# Patient Record
Sex: Male | Born: 1945
Health system: Southern US, Community
[De-identification: ages and names within clinical notes are randomized; demographics above are authoritative.]

## PROBLEM LIST (undated history)

## (undated) DIAGNOSIS — K635 Polyp of colon: Secondary | ICD-10-CM

## (undated) DIAGNOSIS — R55 Syncope and collapse: Secondary | ICD-10-CM

## (undated) DIAGNOSIS — M1990 Unspecified osteoarthritis, unspecified site: Secondary | ICD-10-CM

## (undated) DIAGNOSIS — B019 Varicella without complication: Secondary | ICD-10-CM

## (undated) DIAGNOSIS — K219 Gastro-esophageal reflux disease without esophagitis: Secondary | ICD-10-CM

## (undated) DIAGNOSIS — G500 Trigeminal neuralgia: Secondary | ICD-10-CM

## (undated) HISTORY — DX: Varicella without complication: B01.9

## (undated) HISTORY — PX: REPLACEMENT TOTAL HIP W/  RESURFACING IMPLANTS: SUR1222

## (undated) HISTORY — DX: Polyp of colon: K63.5

## (undated) HISTORY — DX: Gastro-esophageal reflux disease without esophagitis: K21.9

## (undated) HISTORY — DX: Trigeminal neuralgia: G50.0

## (undated) HISTORY — DX: Unspecified osteoarthritis, unspecified site: M19.90

## (undated) HISTORY — DX: Syncope and collapse: R55

---

## 1998-01-23 ENCOUNTER — Ambulatory Visit (HOSPITAL_COMMUNITY): Admission: RE | Admit: 1998-01-23 | Discharge: 1998-01-23 | Payer: Self-pay | Admitting: Gastroenterology

## 1999-02-13 ENCOUNTER — Ambulatory Visit (HOSPITAL_COMMUNITY): Admission: RE | Admit: 1999-02-13 | Discharge: 1999-02-13 | Payer: Self-pay | Admitting: Emergency Medicine

## 1999-02-13 ENCOUNTER — Encounter: Payer: Self-pay | Admitting: Emergency Medicine

## 2001-02-09 ENCOUNTER — Ambulatory Visit (HOSPITAL_COMMUNITY): Admission: RE | Admit: 2001-02-09 | Discharge: 2001-02-09 | Payer: Self-pay | Admitting: Gastroenterology

## 2006-04-14 ENCOUNTER — Encounter: Admission: RE | Admit: 2006-04-14 | Discharge: 2006-04-14 | Payer: Self-pay | Admitting: Emergency Medicine

## 2010-03-31 DIAGNOSIS — K635 Polyp of colon: Secondary | ICD-10-CM

## 2010-03-31 HISTORY — DX: Polyp of colon: K63.5

## 2011-04-01 HISTORY — PX: FOOT SURGERY: SHX648

## 2011-10-22 ENCOUNTER — Encounter: Payer: Self-pay | Admitting: Family Medicine

## 2011-10-22 ENCOUNTER — Ambulatory Visit (INDEPENDENT_AMBULATORY_CARE_PROVIDER_SITE_OTHER): Payer: BC Managed Care – PPO | Admitting: Family Medicine

## 2011-10-22 VITALS — BP 136/80 | HR 72 | Temp 99.2°F | Resp 12 | Ht 71.0 in | Wt 208.0 lb

## 2011-10-22 DIAGNOSIS — I10 Essential (primary) hypertension: Secondary | ICD-10-CM

## 2011-10-22 DIAGNOSIS — D126 Benign neoplasm of colon, unspecified: Secondary | ICD-10-CM

## 2011-10-22 DIAGNOSIS — K635 Polyp of colon: Secondary | ICD-10-CM

## 2011-10-22 DIAGNOSIS — M159 Polyosteoarthritis, unspecified: Secondary | ICD-10-CM

## 2011-10-22 DIAGNOSIS — E785 Hyperlipidemia, unspecified: Secondary | ICD-10-CM | POA: Insufficient documentation

## 2011-10-22 DIAGNOSIS — R55 Syncope and collapse: Secondary | ICD-10-CM

## 2011-10-22 MED ORDER — LISINOPRIL 20 MG PO TABS: 20.0000 mg | ORAL_TABLET | Freq: Every day | ORAL | Status: DC

## 2011-10-22 MED ORDER — SIMVASTATIN 20 MG PO TABS: 20.0000 mg | ORAL_TABLET | Freq: Every evening | ORAL | Status: DC

## 2011-10-22 NOTE — Progress Notes (Signed)
  Subjective:    Patient ID: Adam Choi, male    DOB: [birth year], 66 y.o.   MRN: [id number]  HPI  New patient to establish care. Past medical history significant for hypertension, hyperlipidemia, osteoarthritis mostly involving lumbar spine and hips, past history of syncope, and colon polyps. He is retired Social research officer, government. He also has history of GERD which is fairly well controlled with over-the-counter medications. Only regular medications are lisinopril 20 mg daily and simvastatin 20 mg daily.  Patient had couple previous syncopal episodes which were related to rapid standing and felt to be orthostatic related. Workup unrevealing. No chest pain. No cardiac history. Compliant with medications. Takes Aleve for low back pain which generally works well. Blood pressure well-controlled at home of 130/85 generally.  Family history mother, sister, and daughter with breast cancer history. Mom had type 2 diabetes and history of stroke. Father had emphysema.  Patient is married. He has 2 children and one stepchild. Retired Social research officer, government. Ex-smoker. Occasional alcohol use. Walks for exercise.  Past Medical History  Diagnosis Date  . Arthritis   . GERD (gastroesophageal reflux disease)   . Colon polyp 2012  . Fainting spell   . Chicken pox    Past Surgical History  Procedure Date  . Foot surgery 2013    halux rigidus/bones spur    reports that he quit smoking about 3 years ago. His smoking use included Cigarettes. He has a 15 pack-year smoking history. He does not have any smokeless tobacco history on file. His alcohol and drug histories not on file. family history includes Cancer in his mother; Diabetes in his mother; Emphysema in his father; and Stroke in his mother. No Known Allergies    Review of Systems  Constitutional: Negative for fatigue.  Eyes: Negative for visual disturbance.  Respiratory: Negative for cough, chest tightness and shortness of breath.     Cardiovascular: Negative for chest pain, palpitations and leg swelling.  Neurological: Negative for dizziness, syncope, weakness, light-headedness and headaches.       Objective:   Physical Exam  Constitutional: He appears well-developed and well-nourished.  HENT:  Right Ear: External ear normal.  Left Ear: External ear normal.  Mouth/Throat: Oropharynx is clear and moist.  Neck: Neck supple.  Cardiovascular: Normal rate and regular rhythm.   Pulmonary/Chest: Effort normal and breath sounds normal. No respiratory distress. He has no wheezes. He has no rales.  Musculoskeletal: He exhibits no edema.          Assessment & Plan:  #1 hypertension. Stable by home readings. Refill medication for one year #2 hyperlipidemia. Recent labs reviewed from March of this year from prior practice with lipids at goal. Refill simvastatin for one year #3 osteoarthritis mostly involving hips and back. Continue Aleve #4 history of colon polyps. Followed closely by gastroenterology  #5 history of syncope presumably related orthostasis. One episode occurred after getting blood drawn-next day. Followup promptly if he has any recurrent episodes

## 2012-01-13 ENCOUNTER — Ambulatory Visit: Payer: BC Managed Care – PPO

## 2012-01-14 ENCOUNTER — Ambulatory Visit (INDEPENDENT_AMBULATORY_CARE_PROVIDER_SITE_OTHER): Payer: BC Managed Care – PPO

## 2012-01-14 DIAGNOSIS — Z23 Encounter for immunization: Secondary | ICD-10-CM

## 2012-12-01 ENCOUNTER — Other Ambulatory Visit: Payer: BC Managed Care – PPO

## 2012-12-03 ENCOUNTER — Other Ambulatory Visit (INDEPENDENT_AMBULATORY_CARE_PROVIDER_SITE_OTHER): Payer: Medicare Other

## 2012-12-03 DIAGNOSIS — Z0000 Encounter for general adult medical examination without abnormal findings: Secondary | ICD-10-CM

## 2012-12-03 DIAGNOSIS — Z125 Encounter for screening for malignant neoplasm of prostate: Secondary | ICD-10-CM

## 2012-12-03 DIAGNOSIS — E785 Hyperlipidemia, unspecified: Secondary | ICD-10-CM

## 2012-12-03 LAB — LIPID PANEL
Cholesterol: 187 mg/dL (ref 0–200)
HDL: 61.9 mg/dL (ref >39.00)
LDL Cholesterol: 103 mg/dL — ABNORMAL HIGH (ref 0–99)
Total CHOL/HDL Ratio: 3
Triglycerides: 109 mg/dL (ref 0.0–149.0)
VLDL: 21.8 mg/dL (ref 0.0–40.0)

## 2012-12-03 LAB — BASIC METABOLIC PANEL
BUN: 16 mg/dL (ref 6–23)
CO2: 31 mEq/L (ref 19–32)
Calcium: 9.6 mg/dL (ref 8.4–10.5)
Chloride: 101 mEq/L (ref 96–112)
Creatinine, Ser: 1.3 mg/dL (ref 0.4–1.5)
GFR: 59.81 mL/min — ABNORMAL LOW (ref >60.00)
Glucose, Bld: 84 mg/dL (ref 70–99)
Potassium: 4.6 mEq/L (ref 3.5–5.1)
Sodium: 136 mEq/L (ref 135–145)

## 2012-12-03 LAB — POCT URINALYSIS DIPSTICK
Bilirubin, UA: NEGATIVE
Blood, UA: NEGATIVE
Glucose, UA: NEGATIVE
Ketones, UA: NEGATIVE
Leukocytes, UA: NEGATIVE
Nitrite, UA: NEGATIVE
Protein, UA: NEGATIVE
Spec Grav, UA: 1.01
Urobilinogen, UA: 0.2
pH, UA: 7

## 2012-12-03 LAB — CBC WITH DIFFERENTIAL/PLATELET
Basophils Absolute: 0 10*3/uL (ref 0.0–0.1)
Basophils Relative: 0.6 % (ref 0.0–3.0)
Eosinophils Absolute: 0.1 10*3/uL (ref 0.0–0.7)
Eosinophils Relative: 1.4 % (ref 0.0–5.0)
HCT: 44.3 % (ref 39.0–52.0)
Hemoglobin: 15 g/dL (ref 13.0–17.0)
Lymphocytes Relative: 27.8 % (ref 12.0–46.0)
Lymphs Abs: 1.9 10*3/uL (ref 0.7–4.0)
MCHC: 33.8 g/dL (ref 30.0–36.0)
MCV: 91.9 fl (ref 78.0–100.0)
Monocytes Absolute: 0.6 10*3/uL (ref 0.1–1.0)
Monocytes Relative: 9.3 % (ref 3.0–12.0)
Neutro Abs: 4.1 10*3/uL (ref 1.4–7.7)
Neutrophils Relative %: 60.9 % (ref 43.0–77.0)
Platelets: 232 10*3/uL (ref 150.0–400.0)
RBC: 4.82 Mil/uL (ref 4.22–5.81)
RDW: 13 % (ref 11.5–14.6)
WBC: 6.8 10*3/uL (ref 4.5–10.5)

## 2012-12-03 LAB — HEPATIC FUNCTION PANEL
Albumin: 4 g/dL (ref 3.5–5.2)
Alkaline Phosphatase: 58 U/L (ref 39–117)
Total Protein: 6.8 g/dL (ref 6.0–8.3)

## 2012-12-03 LAB — TSH: TSH: 1.16 u[IU]/mL (ref 0.35–5.50)

## 2012-12-03 LAB — PSA: PSA: 0.53 ng/mL (ref 0.10–4.00)

## 2012-12-13 ENCOUNTER — Encounter: Payer: Self-pay | Admitting: Family Medicine

## 2012-12-13 ENCOUNTER — Ambulatory Visit (INDEPENDENT_AMBULATORY_CARE_PROVIDER_SITE_OTHER): Payer: Medicare Other | Admitting: Family Medicine

## 2012-12-13 VITALS — BP 128/68 | HR 84 | Temp 98.1°F | Ht 71.0 in | Wt 203.0 lb

## 2012-12-13 DIAGNOSIS — Z0000 Encounter for general adult medical examination without abnormal findings: Secondary | ICD-10-CM

## 2012-12-13 DIAGNOSIS — Z23 Encounter for immunization: Secondary | ICD-10-CM

## 2012-12-13 DIAGNOSIS — Z139 Encounter for screening, unspecified: Secondary | ICD-10-CM

## 2012-12-13 MED ORDER — SIMVASTATIN 20 MG PO TABS: 20.0000 mg | ORAL_TABLET | Freq: Every evening | ORAL | Status: DC

## 2012-12-13 MED ORDER — LISINOPRIL 20 MG PO TABS: 20.0000 mg | ORAL_TABLET | Freq: Every day | ORAL | Status: DC

## 2012-12-13 NOTE — Progress Notes (Signed)
  Subjective:    Patient ID: Adam Choi, male    DOB: 19-Jan-1946, 67 y.o.   MRN: [id number]  HPI Patient for complete physical. Past medical history significant for hyperlipidemia and hypertension. Had colon polyps has had regular colonoscopies including most recent 2 years ago. He needs flu vaccine and Pneumovax. Shingles is up-to-date. Tetanus is up-to-date He exercises regularly. He has had some osteoarthritis and is coping fairly well  He quit smoking 4 years ago. He is asymptomatic in terms of any cough or dyspnea but would like to consider lung CT scanning  Past Medical History  Diagnosis Date  . Arthritis   . GERD (gastroesophageal reflux disease)   . Colon polyp 2012  . Fainting spell   . Chicken pox    Past Surgical History  Procedure Laterality Date  . Foot surgery  2013    halux rigidus/bones spur    reports that he quit smoking about 4 years ago. His smoking use included Cigarettes. He has a 15 pack-year smoking history. He does not have any smokeless tobacco history on file. His alcohol and drug histories are not on file. family history includes Cancer in his mother; Diabetes in his mother; Emphysema in his father; Stroke in his mother. No Known Allergies    Review of Systems  Constitutional: Negative for fever, activity change, appetite change and fatigue.  HENT: Negative for ear pain, congestion and trouble swallowing.   Eyes: Negative for pain and visual disturbance.  Respiratory: Negative for cough, shortness of breath and wheezing.   Cardiovascular: Negative for chest pain and palpitations.  Gastrointestinal: Negative for nausea, vomiting, abdominal pain, diarrhea, constipation, blood in stool, abdominal distention and rectal pain.  Genitourinary: Negative for dysuria, hematuria and testicular pain.  Musculoskeletal: Negative for joint swelling and arthralgias.  Skin: Negative for rash.  Neurological: Negative for dizziness, syncope and headaches.   Hematological: Negative for adenopathy.  Psychiatric/Behavioral: Negative for confusion and dysphoric mood.       Objective:   Physical Exam  Constitutional: He is oriented to person, place, and time. He appears well-developed and well-nourished. No distress.  HENT:  Head: Normocephalic and atraumatic.  Right Ear: External ear normal.  Left Ear: External ear normal.  Mouth/Throat: Oropharynx is clear and moist.  Eyes: Conjunctivae and EOM are normal. Pupils are equal, round, and reactive to light.  Neck: Normal range of motion. Neck supple. No thyromegaly present.  Cardiovascular: Normal rate, regular rhythm and normal heart sounds.   No murmur heard. Pulmonary/Chest: No respiratory distress. He has no wheezes. He has no rales.  Abdominal: Soft. Bowel sounds are normal. He exhibits no distension and no mass. There is no tenderness. There is no rebound and no guarding.  Musculoskeletal: He exhibits no edema.  Lymphadenopathy:    He has no cervical adenopathy.  Neurological: He is alert and oriented to person, place, and time. He displays normal reflexes. No cranial nerve deficit.  Skin: No rash noted.  Psychiatric: He has a normal mood and affect.          Assessment & Plan:  Complete physical. Pneumovax and flu vaccines given. Labs reviewed with no major abnormalities. Tetanus up-to-date. Colonoscopy up to date. Patient inquires regarding low dose CT scanning for lung cancer because of his past history. We discussed pros and cons. We'll set up

## 2012-12-20 ENCOUNTER — Telehealth: Payer: Self-pay | Admitting: Family Medicine

## 2012-12-20 DIAGNOSIS — Z87891 Personal history of nicotine dependence: Secondary | ICD-10-CM

## 2012-12-20 NOTE — Telephone Encounter (Signed)
Patient would like an order for Lung Cancer Ct screening at [facility] [phone].

## 2012-12-20 NOTE — Telephone Encounter (Signed)
What is the diagnosis or reason for the screening and I will put in the order

## 2012-12-20 NOTE — Telephone Encounter (Signed)
"  history of smoking"

## 2012-12-20 NOTE — Telephone Encounter (Signed)
Pt was given the lung cancer CT screening form, and came up here to the office wanting a order

## 2012-12-20 NOTE — Telephone Encounter (Signed)
OK to set up 

## 2012-12-21 NOTE — Telephone Encounter (Signed)
Left detailed message for patient, he can call and set appointment for his lung cancer screening.

## 2012-12-21 NOTE — Telephone Encounter (Signed)
Adam Choi,  We need to make sure this is Chest CT low dose for lung ca screening and not regular CT chest

## 2012-12-23 ENCOUNTER — Ambulatory Visit
Admission: RE | Admit: 2012-12-23 | Discharge: 2012-12-23 | Disposition: A | Discharge status: Home / Self Care | Payer: No Typology Code available for payment source | Source: Ambulatory Visit | Attending: Family Medicine | Admitting: Family Medicine

## 2012-12-23 DIAGNOSIS — Z87891 Personal history of nicotine dependence: Secondary | ICD-10-CM

## 2012-12-24 ENCOUNTER — Telehealth: Payer: Self-pay | Admitting: Family Medicine

## 2012-12-24 NOTE — Telephone Encounter (Signed)
Recv call from Erskine Squibb at radiology to notify you CT of Chest images are available in Epic for review.  States pt just needs to repeat in 12 months.   Thanks,  Archie Patten

## 2013-03-20 ENCOUNTER — Other Ambulatory Visit: Payer: Self-pay | Admitting: Family Medicine

## 2014-01-02 ENCOUNTER — Other Ambulatory Visit: Payer: Self-pay | Admitting: Family Medicine

## 2014-01-04 ENCOUNTER — Ambulatory Visit (INDEPENDENT_AMBULATORY_CARE_PROVIDER_SITE_OTHER): Payer: Medicare Other

## 2014-01-04 DIAGNOSIS — Z23 Encounter for immunization: Secondary | ICD-10-CM

## 2014-01-24 ENCOUNTER — Other Ambulatory Visit: Payer: Self-pay | Admitting: Family Medicine

## 2014-02-02 ENCOUNTER — Telehealth: Payer: Self-pay | Admitting: Family Medicine

## 2014-02-02 MED ORDER — LISINOPRIL 20 MG PO TABS: 20.0000 mg | ORAL_TABLET | Freq: Every day | ORAL | Status: DC

## 2014-02-02 NOTE — Telephone Encounter (Signed)
Rx sent to pharmacy   

## 2014-02-02 NOTE — Telephone Encounter (Signed)
Needs Lisinopril 20mg  sent to [facility], he is out of medication and has a CPE scheduled for next week.

## 2014-02-06 ENCOUNTER — Other Ambulatory Visit (INDEPENDENT_AMBULATORY_CARE_PROVIDER_SITE_OTHER): Payer: Medicare Other

## 2014-02-06 DIAGNOSIS — E785 Hyperlipidemia, unspecified: Secondary | ICD-10-CM | POA: Diagnosis not present

## 2014-02-06 DIAGNOSIS — I10 Essential (primary) hypertension: Secondary | ICD-10-CM

## 2014-02-06 DIAGNOSIS — Z125 Encounter for screening for malignant neoplasm of prostate: Secondary | ICD-10-CM

## 2014-02-06 DIAGNOSIS — Z0000 Encounter for general adult medical examination without abnormal findings: Secondary | ICD-10-CM

## 2014-02-06 LAB — CBC WITH DIFFERENTIAL/PLATELET
Basophils Absolute: 0.1 10*3/uL (ref 0.0–0.1)
Basophils Relative: 0.8 % (ref 0.0–3.0)
EOS PCT: 3.1 % (ref 0.0–5.0)
Eosinophils Absolute: 0.2 10*3/uL (ref 0.0–0.7)
HEMATOCRIT: 48.4 % (ref 39.0–52.0)
HEMOGLOBIN: 15.9 g/dL (ref 13.0–17.0)
Lymphocytes Relative: 23.9 % (ref 12.0–46.0)
Lymphs Abs: 1.9 10*3/uL (ref 0.7–4.0)
MCHC: 32.8 g/dL (ref 30.0–36.0)
MCV: 92.2 fl (ref 78.0–100.0)
MONOS PCT: 7.9 % (ref 3.0–12.0)
Monocytes Absolute: 0.6 10*3/uL (ref 0.1–1.0)
NEUTROS ABS: 5.2 10*3/uL (ref 1.4–7.7)
Neutrophils Relative %: 64.3 % (ref 43.0–77.0)
Platelets: 214 10*3/uL (ref 150.0–400.0)
RBC: 5.25 Mil/uL (ref 4.22–5.81)
RDW: 13.8 % (ref 11.5–15.5)
WBC: 8 10*3/uL (ref 4.0–10.5)

## 2014-02-06 LAB — POCT URINALYSIS DIPSTICK
Bilirubin, UA: NEGATIVE
GLUCOSE UA: NEGATIVE
KETONES UA: NEGATIVE
Leukocytes, UA: NEGATIVE
Nitrite, UA: NEGATIVE
PROTEIN UA: NEGATIVE
RBC UA: NEGATIVE
SPEC GRAV UA: 1.015
UROBILINOGEN UA: 0.2
pH, UA: 6

## 2014-02-06 LAB — BASIC METABOLIC PANEL
BUN: 14 mg/dL (ref 6–23)
CO2: 29 mEq/L (ref 19–32)
Calcium: 10.2 mg/dL (ref 8.4–10.5)
Chloride: 102 mEq/L (ref 96–112)
Creatinine, Ser: 1.1 mg/dL (ref 0.4–1.5)
GFR: 68.82 mL/min (ref >60.00)
GLUCOSE: 87 mg/dL (ref 70–99)
POTASSIUM: 4.7 meq/L (ref 3.5–5.1)
SODIUM: 141 meq/L (ref 135–145)

## 2014-02-06 LAB — LIPID PANEL
CHOL/HDL RATIO: 4
Cholesterol: 210 mg/dL — ABNORMAL HIGH (ref 0–200)
HDL: 59.4 mg/dL (ref >39.00)
LDL CALC: 117 mg/dL — AB (ref 0–99)
NonHDL: 150.6
TRIGLYCERIDES: 167 mg/dL — AB (ref 0.0–149.0)
VLDL: 33.4 mg/dL (ref 0.0–40.0)

## 2014-02-06 LAB — HEPATIC FUNCTION PANEL
ALBUMIN: 3.6 g/dL (ref 3.5–5.2)
ALT: 19 U/L (ref 0–53)
AST: 20 U/L (ref 0–37)
Alkaline Phosphatase: 57 U/L (ref 39–117)
Bilirubin, Direct: 0.2 mg/dL (ref 0.0–0.3)
Total Bilirubin: 1.2 mg/dL (ref 0.2–1.2)
Total Protein: 7.1 g/dL (ref 6.0–8.3)

## 2014-02-06 LAB — TSH: TSH: 2.14 u[IU]/mL (ref 0.35–4.50)

## 2014-02-06 LAB — PSA: PSA: 0.52 ng/mL (ref 0.10–4.00)

## 2014-02-10 ENCOUNTER — Encounter: Payer: Self-pay | Admitting: Family Medicine

## 2014-02-10 ENCOUNTER — Ambulatory Visit (INDEPENDENT_AMBULATORY_CARE_PROVIDER_SITE_OTHER): Payer: Medicare Other | Admitting: Family Medicine

## 2014-02-10 VITALS — BP 110/80 | HR 60 | Temp 98.5°F | Ht 72.0 in | Wt 198.0 lb

## 2014-02-10 DIAGNOSIS — Z0000 Encounter for general adult medical examination without abnormal findings: Secondary | ICD-10-CM

## 2014-02-10 DIAGNOSIS — Z23 Encounter for immunization: Secondary | ICD-10-CM

## 2014-02-10 MED ORDER — LISINOPRIL 20 MG PO TABS: 20.0000 mg | ORAL_TABLET | Freq: Every day | ORAL | Status: DC

## 2014-02-10 MED ORDER — SIMVASTATIN 20 MG PO TABS: 20.0000 mg | ORAL_TABLET | Freq: Every evening | ORAL | Status: DC

## 2014-02-10 NOTE — Patient Instructions (Signed)
Continue annual flu vaccine Consider annual low-dose CT scanning of chest to screen for lung cancer Consider baby aspirin 81 mg daily

## 2014-02-10 NOTE — Progress Notes (Signed)
Pre visit review using our clinic review tool, if applicable. No additional management support is needed unless otherwise documented below in the visit note. 

## 2014-02-10 NOTE — Progress Notes (Signed)
   Subjective:    Patient ID: Adam Choi, male    DOB: Jan 20, 1946, 68 y.o.   MRN: [id number]  HPI Patient here for complete physical. He has hypertension and hyperlipidemia. Medications reviewed and compliant with all. He had quit smokingabout 5 years ago. He had low-dose CT scanning of the lung last year which was unremarkable except for some calcified coronary arteries. He denies any recent chest pain. He does not take aspirin. Overall feels well. walks for exercise. Needs Prevnar 13. Other physicians up-to-date. Colonoscopy up-to-date.  Past Medical History  Diagnosis Date  . Arthritis   . GERD (gastroesophageal reflux disease)   . Colon polyp 2012  . Fainting spell   . Chicken pox    Past Surgical History  Procedure Laterality Date  . Foot surgery  2013    halux rigidus/bones spur    reports that he quit smoking about 5 years ago. His smoking use included Cigarettes. He has a 15 pack-year smoking history. He does not have any smokeless tobacco history on file. His alcohol and drug histories are not on file. family history includes Cancer in his mother; Diabetes in his mother; Emphysema in his father; Stroke in his mother. No Known Allergies    Review of Systems  Constitutional: Negative for fever, activity change, appetite change and fatigue.  HENT: Negative for congestion, ear pain and trouble swallowing.   Eyes: Negative for pain and visual disturbance.  Respiratory: Negative for cough, shortness of breath and wheezing.   Cardiovascular: Negative for chest pain and palpitations.  Gastrointestinal: Negative for nausea, vomiting, abdominal pain, diarrhea, constipation, blood in stool, abdominal distention and rectal pain.  Genitourinary: Negative for dysuria, hematuria and testicular pain.  Musculoskeletal: Negative for joint swelling and arthralgias.  Skin: Negative for rash.  Neurological: Negative for dizziness, syncope and headaches.  Hematological: Negative for  adenopathy.  Psychiatric/Behavioral: Negative for confusion and dysphoric mood.       Objective:   Physical Exam  Constitutional: He is oriented to person, place, and time. He appears well-developed and well-nourished. No distress.  HENT:  Head: Normocephalic and atraumatic.  Right Ear: External ear normal.  Left Ear: External ear normal.  Mouth/Throat: Oropharynx is clear and moist.  Eyes: Conjunctivae and EOM are normal. Pupils are equal, round, and reactive to light.  Neck: Normal range of motion. Neck supple. No thyromegaly present.  Cardiovascular: Normal rate, regular rhythm and normal heart sounds.   No murmur heard. Pulmonary/Chest: No respiratory distress. He has no wheezes. He has no rales.  Abdominal: Soft. Bowel sounds are normal. He exhibits no distension and no mass. There is no tenderness. There is no rebound and no guarding.  Musculoskeletal: He exhibits no edema.  Lymphadenopathy:    He has no cervical adenopathy.  Neurological: He is alert and oriented to person, place, and time. He displays normal reflexes. No cranial nerve deficit.  Skin: No rash noted.  Psychiatric: He has a normal mood and affect.          Assessment & Plan:  Health maintenance. Prevnar 13 given. We discussed annual low-dose chest CT scanning with prior history of smoking and he is not interested at this point. He may go to every other year. We have recommended baby aspirin one daily. Refill medications for one year. Labs reviewed with patient

## 2014-08-07 ENCOUNTER — Ambulatory Visit (INDEPENDENT_AMBULATORY_CARE_PROVIDER_SITE_OTHER): Payer: Medicare Other | Admitting: Family Medicine

## 2014-08-07 ENCOUNTER — Encounter: Payer: Self-pay | Admitting: Family Medicine

## 2014-08-07 VITALS — BP 120/80 | HR 88 | Temp 98.6°F | Wt 201.0 lb

## 2014-08-07 DIAGNOSIS — M791 Myalgia: Secondary | ICD-10-CM | POA: Diagnosis not present

## 2014-08-07 DIAGNOSIS — M542 Cervicalgia: Secondary | ICD-10-CM | POA: Diagnosis not present

## 2014-08-07 DIAGNOSIS — M7918 Myalgia, other site: Secondary | ICD-10-CM

## 2014-08-07 MED ORDER — PREDNISONE 10 MG PO TABS: ORAL_TABLET | ORAL | Status: DC

## 2014-08-07 NOTE — Progress Notes (Signed)
   Subjective:    Patient ID: Adam Choi, male    DOB: [birth year], 69 y.o.   MRN: [id number]  HPI Patient is seen with neck pain and right buttock pain. He's had about 4-6 weeks of dull bilateral neck pain lower cervical region. No radiculopathy symptoms. No upper extremity numbness or weakness.  He describes a different type of pain which is mostly in his right buttock which rarely radiates into the thigh. This is a sharp pain which is very intermittent and worse when he is lying flat. He does not have any pain with sitting or walking. No recent injuries. He's tried Aleve without much improvement. Denies any upper or lower extremity weakness.  Past Medical History  Diagnosis Date  . Arthritis   . GERD (gastroesophageal reflux disease)   . Colon polyp 2012  . Fainting spell   . Chicken pox    Past Surgical History  Procedure Laterality Date  . Foot surgery  2013    halux rigidus/bones spur    reports that he quit smoking about 6 years ago. His smoking use included Cigarettes. He has a 15 pack-year smoking history. He does not have any smokeless tobacco history on file. His alcohol and drug histories are not on file. family history includes Cancer in his mother; Diabetes in his mother; Emphysema in his father; Stroke in his mother. No Known Allergies    Review of Systems  Constitutional: Negative for fever and chills.  Cardiovascular: Negative for chest pain.  Neurological: Negative for weakness and numbness.       Objective:   Physical Exam  Constitutional: He appears well-developed and well-nourished.  Cardiovascular: Normal rate and regular rhythm.   Pulmonary/Chest: Effort normal and breath sounds normal. No respiratory distress. He has no wheezes. He has no rales.  Musculoskeletal:  Good range of motion cervical flexion but he has somewhat limited range of motion with lateral bending and rotation of the neck. No localized spinal tenderness  Straight leg raise are  negative bilaterally. Right hip full range of motion. No lateral hip tenderness  Neurological:  Full strength upper and lower extremities. No muscle atrophy. Symmetric reflexes upper and lower extremities.          Assessment & Plan:  Patient presents with cervical neck pains and right buttock pain. Suspect he may have some sciatic nerve irritation right buttock. Nonfocal exam neurologically. Trial of prednisone taper. Touch base in 2 weeks if no improvement. May need to get cervical neck films and lumbar films that point if no better.

## 2014-08-07 NOTE — Progress Notes (Signed)
Pre visit review using our clinic review tool, if applicable. No additional management support is needed unless otherwise documented below in the visit note. 

## 2014-08-07 NOTE — Progress Notes (Signed)
   Subjective:    Patient ID: Adam Choi, male    DOB: 04-Mar-1946, 69 y.o.   MRN: [id number]  HPI    Review of Systems     Objective:   Physical Exam        Assessment & Plan:

## 2014-08-07 NOTE — Patient Instructions (Signed)
Follow up in 2-3 weeks if no improvement.

## 2014-08-18 ENCOUNTER — Encounter: Payer: Self-pay | Admitting: Family Medicine

## 2014-08-25 ENCOUNTER — Other Ambulatory Visit: Payer: Self-pay | Admitting: Family Medicine

## 2014-08-25 DIAGNOSIS — M542 Cervicalgia: Secondary | ICD-10-CM

## 2014-08-25 NOTE — Telephone Encounter (Signed)
Pt called to see if you received his message. Pt states the hip pain was resolved while on prednisone, but returned when he finished regimen. The neck pain was not affected as all.  Pain not as bad as before. But still there. Pt states you spoke about maybe xrays?? Would like to know what he should do next? Pt was hoping to hear back from you.  Ok to use [employer]. Pls advise thanks.

## 2014-08-31 ENCOUNTER — Other Ambulatory Visit: Payer: Self-pay | Admitting: Family Medicine

## 2014-08-31 ENCOUNTER — Ambulatory Visit (INDEPENDENT_AMBULATORY_CARE_PROVIDER_SITE_OTHER)
Admission: RE | Admit: 2014-08-31 | Discharge: 2014-08-31 | Disposition: A | Discharge status: Home / Self Care | Payer: Medicare Other | Source: Ambulatory Visit | Attending: Family Medicine | Admitting: Family Medicine

## 2014-08-31 DIAGNOSIS — M542 Cervicalgia: Secondary | ICD-10-CM | POA: Diagnosis not present

## 2015-02-02 ENCOUNTER — Ambulatory Visit (INDEPENDENT_AMBULATORY_CARE_PROVIDER_SITE_OTHER): Payer: Medicare Other

## 2015-02-02 DIAGNOSIS — Z23 Encounter for immunization: Secondary | ICD-10-CM | POA: Diagnosis not present

## 2015-05-03 ENCOUNTER — Other Ambulatory Visit: Payer: Self-pay | Admitting: Family Medicine

## 2015-09-21 ENCOUNTER — Other Ambulatory Visit (INDEPENDENT_AMBULATORY_CARE_PROVIDER_SITE_OTHER): Payer: Medicare Other

## 2015-09-21 DIAGNOSIS — Z0000 Encounter for general adult medical examination without abnormal findings: Secondary | ICD-10-CM | POA: Diagnosis not present

## 2015-09-21 LAB — CBC WITH DIFFERENTIAL/PLATELET
Basophils Absolute: 0.1 10*3/uL (ref 0.0–0.1)
Basophils Relative: 1.2 % (ref 0.0–3.0)
EOS PCT: 4.1 % (ref 0.0–5.0)
Eosinophils Absolute: 0.2 10*3/uL (ref 0.0–0.7)
HCT: 43.8 % (ref 39.0–52.0)
Hemoglobin: 14.6 g/dL (ref 13.0–17.0)
LYMPHS ABS: 1.7 10*3/uL (ref 0.7–4.0)
LYMPHS PCT: 30.9 % (ref 12.0–46.0)
MCHC: 33.3 g/dL (ref 30.0–36.0)
MCV: 91.2 fl (ref 78.0–100.0)
Monocytes Absolute: 0.6 10*3/uL (ref 0.1–1.0)
Monocytes Relative: 11.4 % (ref 3.0–12.0)
NEUTROS PCT: 52.4 % (ref 43.0–77.0)
Neutro Abs: 2.8 10*3/uL (ref 1.4–7.7)
PLATELETS: 211 10*3/uL (ref 150.0–400.0)
RBC: 4.8 Mil/uL (ref 4.22–5.81)
RDW: 14.1 % (ref 11.5–15.5)
WBC: 5.4 10*3/uL (ref 4.0–10.5)

## 2015-09-21 LAB — LIPID PANEL
CHOLESTEROL: 181 mg/dL (ref 0–200)
HDL: 67.3 mg/dL (ref >39.00)
LDL CALC: 90 mg/dL (ref 0–99)
NonHDL: 114.17
TRIGLYCERIDES: 120 mg/dL (ref 0.0–149.0)
Total CHOL/HDL Ratio: 3
VLDL: 24 mg/dL (ref 0.0–40.0)

## 2015-09-21 LAB — BASIC METABOLIC PANEL
BUN: 14 mg/dL (ref 6–23)
CHLORIDE: 104 meq/L (ref 96–112)
CO2: 29 meq/L (ref 19–32)
CREATININE: 1.23 mg/dL (ref 0.40–1.50)
Calcium: 9.8 mg/dL (ref 8.4–10.5)
GFR: 62.1 mL/min (ref >60.00)
Glucose, Bld: 81 mg/dL (ref 70–99)
POTASSIUM: 4.7 meq/L (ref 3.5–5.1)
Sodium: 140 mEq/L (ref 135–145)

## 2015-09-21 LAB — HEPATIC FUNCTION PANEL
ALBUMIN: 4.3 g/dL (ref 3.5–5.2)
ALT: 14 U/L (ref 0–53)
AST: 16 U/L (ref 0–37)
Alkaline Phosphatase: 56 U/L (ref 39–117)
BILIRUBIN TOTAL: 0.8 mg/dL (ref 0.2–1.2)
Bilirubin, Direct: 0.1 mg/dL (ref 0.0–0.3)
Total Protein: 6.7 g/dL (ref 6.0–8.3)

## 2015-09-21 LAB — TSH: TSH: 1.26 u[IU]/mL (ref 0.35–4.50)

## 2015-09-21 LAB — PSA: PSA: 0.49 ng/mL (ref 0.10–4.00)

## 2015-09-28 ENCOUNTER — Encounter: Payer: Self-pay | Admitting: Family Medicine

## 2015-09-28 ENCOUNTER — Ambulatory Visit (INDEPENDENT_AMBULATORY_CARE_PROVIDER_SITE_OTHER): Payer: Medicare Other | Admitting: Family Medicine

## 2015-09-28 VITALS — BP 120/80 | HR 91 | Temp 98.2°F | Ht 70.5 in | Wt 198.0 lb

## 2015-09-28 DIAGNOSIS — Z129 Encounter for screening for malignant neoplasm, site unspecified: Secondary | ICD-10-CM | POA: Diagnosis not present

## 2015-09-28 DIAGNOSIS — M5417 Radiculopathy, lumbosacral region: Secondary | ICD-10-CM | POA: Diagnosis not present

## 2015-09-28 DIAGNOSIS — Z0000 Encounter for general adult medical examination without abnormal findings: Secondary | ICD-10-CM

## 2015-09-28 DIAGNOSIS — G4700 Insomnia, unspecified: Secondary | ICD-10-CM

## 2015-09-28 DIAGNOSIS — F5104 Psychophysiologic insomnia: Secondary | ICD-10-CM

## 2015-09-28 DIAGNOSIS — M5416 Radiculopathy, lumbar region: Secondary | ICD-10-CM

## 2015-09-28 MED ORDER — GABAPENTIN 100 MG PO CAPS: 100.0000 mg | ORAL_CAPSULE | Freq: Every day | ORAL | Status: DC

## 2015-09-28 NOTE — Progress Notes (Signed)
Subjective:    Patient ID: Adam Paganini., male    DOB: [birth year], 70 y.o.   MRN: [id number]  HPI Patient seen for physical exam- and for issues below.. He has history osteoarthritis, hypertension, hyperlipidemia. Immunizations are up-to-date. Colonoscopy up-to-date. No specific risk factors for hepatitis C.  Concern for possible obstructive sleep apnea. His wife has noted that he has occasional observed apneas episodes usually only lasting about 5 seconds. Also has frequent snoring. Minimal daytime somnolence. Does not feel particularly fatigued during the day.  Patient's had some persistent pains that radiate from his right buttock region down the leg at night. He actually does not have any pain with walking. No weakness. No loss of urine or stool control. Pains are quite sharp and sometimes severe at night and he thinks is a big part of his not sleeping well at night. Not relieved with over-the-counter analgesics  Patient quit smoking about 8 years ago. 30+ pack year history. Asymptomatic. He had low-dose CT scan of the lung 3 years ago and would like to get this repeated.  Past Medical History  Diagnosis Date  . Arthritis   . GERD (gastroesophageal reflux disease)   . Colon polyp 2012  . Fainting spell   . Chicken pox    Past Surgical History  Procedure Laterality Date  . Foot surgery  2013    halux rigidus/bones spur    reports that he quit smoking about 7 years ago. His smoking use included Cigarettes. He has a 15 pack-year smoking history. He does not have any smokeless tobacco history on file. His alcohol and drug histories are not on file. family history includes Cancer in his mother; Diabetes in his mother; Emphysema in his father; Stroke in his mother. No Known Allergies     Review of Systems  Constitutional: Negative for fever, activity change, appetite change and fatigue.  HENT: Negative for congestion, ear pain and trouble swallowing.   Eyes: Negative  for pain and visual disturbance.  Respiratory: Negative for cough, shortness of breath and wheezing.   Cardiovascular: Negative for chest pain and palpitations.  Gastrointestinal: Negative for nausea, vomiting, abdominal pain, diarrhea, constipation, blood in stool, abdominal distention and rectal pain.  Genitourinary: Negative for dysuria, hematuria and testicular pain.  Musculoskeletal: Positive for back pain and arthralgias. Negative for joint swelling.  Skin: Negative for rash.  Neurological: Negative for dizziness, syncope and headaches.  Hematological: Negative for adenopathy.  Psychiatric/Behavioral: Negative for confusion and dysphoric mood.       Objective:   Physical Exam  Constitutional: He is oriented to person, place, and time. He appears well-developed and well-nourished. No distress.  HENT:  Head: Normocephalic and atraumatic.  Right Ear: External ear normal.  Left Ear: External ear normal.  Mouth/Throat: Oropharynx is clear and moist.  Eyes: Conjunctivae and EOM are normal. Pupils are equal, round, and reactive to light.  Neck: Normal range of motion. Neck supple. No thyromegaly present.  Cardiovascular: Normal rate, regular rhythm and normal heart sounds.   No murmur heard. Pulmonary/Chest: No respiratory distress. He has no wheezes. He has no rales.  Abdominal: Soft. Bowel sounds are normal. He exhibits no distension and no mass. There is no tenderness. There is no rebound and no guarding.  Musculoskeletal: He exhibits no edema.  Straight leg raises are negative  Lymphadenopathy:    He has no cervical adenopathy.  Neurological: He is alert and oriented to person, place, and time. He displays normal reflexes. No cranial nerve deficit.  Full-strength lower extremities. Symmetric reflexes.  Skin: No rash noted.  Psychiatric: He has a normal mood and affect.          Assessment & Plan:  #1 physical exam. Continue yearly flu vaccine. Other immunizations  up-to-date. Colonoscopy up-to-date. Continue with regular walking/ exercise habits. Set up low-dose CT lung for lung cancer screening. He meets criteria.  #2 chronic insomnia. Sleep hygiene discussed. He does drink some wine at night but even when he eliminates this has difficulty sleeping. Concern for possible obstructive sleep apnea. Epworth sleepiness scale score 5. Discussed possible sleep studies but at this point he wishes to wait  #3 chronic right lumbar radiculitis symptoms. Nonfocal exam neurologically. We recommended trial of gabapentin 100 mg daily at bedtime titrate up 100 mg every couple nights to target of 300 mg if needed. Touch base in a couple weeks for follow-up  Eulas Post MD [facility]

## 2015-09-28 NOTE — Progress Notes (Signed)
Pre visit review using our clinic review tool, if applicable. No additional management support is needed unless otherwise documented below in the visit note. 

## 2015-09-28 NOTE — Patient Instructions (Signed)
Start Gabapentin at 100 mg at night. May increased dose by 100 mg every 2-3 nights up to dose of 300 mg

## 2015-10-04 ENCOUNTER — Telehealth: Payer: Self-pay | Admitting: Family Medicine

## 2015-10-04 ENCOUNTER — Encounter: Payer: Self-pay | Admitting: Family Medicine

## 2015-10-04 DIAGNOSIS — Z129 Encounter for screening for malignant neoplasm, site unspecified: Secondary | ICD-10-CM

## 2015-10-04 NOTE — Telephone Encounter (Signed)
The referral for the pre-cancerous ct order can not be done that way. Must be an  ambulatory referral lung cancer screening  Please place a new order

## 2015-10-04 NOTE — Telephone Encounter (Signed)
Order has been changed

## 2015-10-08 ENCOUNTER — Other Ambulatory Visit: Payer: Self-pay | Admitting: Acute Care

## 2015-10-08 DIAGNOSIS — Z87891 Personal history of nicotine dependence: Secondary | ICD-10-CM

## 2015-10-18 ENCOUNTER — Encounter: Payer: Self-pay | Admitting: Family Medicine

## 2015-10-19 ENCOUNTER — Telehealth: Payer: Self-pay | Admitting: Acute Care

## 2015-10-19 NOTE — Telephone Encounter (Signed)
CT has been rescheduled on 11/19/2015 @ 11:00am at [facility] CT the same day as SDMV with Judson Roch

## 2015-10-19 NOTE — Telephone Encounter (Signed)
Called spoke with pt.  SDMV has been rescheduled to 11/19/15 at 10am  Will forward message to Rodena Piety to reschedule CT

## 2015-10-19 NOTE — Telephone Encounter (Signed)
Will need to reschedule pt's SDMV as SG is out of the office the original date LVM for pt to return call.

## 2015-10-25 ENCOUNTER — Encounter: Payer: Medicare Other | Admitting: Acute Care

## 2015-10-25 ENCOUNTER — Inpatient Hospital Stay: Admission: RE | Admit: 2015-10-25 | Payer: Medicare Other | Source: Ambulatory Visit

## 2015-11-18 ENCOUNTER — Encounter: Payer: Self-pay | Admitting: Family Medicine

## 2015-11-19 ENCOUNTER — Ambulatory Visit (INDEPENDENT_AMBULATORY_CARE_PROVIDER_SITE_OTHER)
Admission: RE | Admit: 2015-11-19 | Discharge: 2015-11-19 | Disposition: A | Discharge status: Home / Self Care | Payer: Medicare Other | Source: Ambulatory Visit | Attending: Acute Care | Admitting: Acute Care

## 2015-11-19 ENCOUNTER — Encounter: Payer: Self-pay | Admitting: Acute Care

## 2015-11-19 ENCOUNTER — Ambulatory Visit (INDEPENDENT_AMBULATORY_CARE_PROVIDER_SITE_OTHER): Payer: Medicare Other | Admitting: Acute Care

## 2015-11-19 DIAGNOSIS — Z87891 Personal history of nicotine dependence: Secondary | ICD-10-CM

## 2015-11-19 NOTE — Progress Notes (Signed)
Shared Decision Making Visit Lung Cancer Screening Program [phone])   Eligibility:  Age 70 y.o.  Pack Years Smoking History Calculation 44 pack year smoking history (# packs/per year x # years smoked)  Recent History of coughing up blood  no  Unexplained weight loss? no ( >Than 15 pounds within the last 6 months )  Prior History Lung / other cancer no (Diagnosis within the last 5 years already requiring surveillance chest CT Scans).  Smoking Status Former Smoker  Former Smokers: Years since quit: 8 years  Quit Date: 05/2007  Visit Components:  Discussion included one or more decision making aids. yes  Discussion included risk/benefits of screening. yes  Discussion included potential follow up diagnostic testing for abnormal scans. yes  Discussion included meaning and risk of over diagnosis. yes  Discussion included meaning and risk of False Positives. yes  Discussion included meaning of total radiation exposure. yes  Counseling Included:  Importance of adherence to annual lung cancer LDCT screening. yes  Impact of comorbidities on ability to participate in the program. yes  Ability and willingness to under diagnostic treatment. yes  Smoking Cessation Counseling:  Current Smokers:   Discussed importance of smoking cessation.NA Former smoker  Information about tobacco cessation classes and interventions provided to patient. NA; former smoker  Patient provided with "ticket" for LDCT Scan. yes  Symptomatic Patient. no  CounselingNA  Diagnosis Code: Tobacco Use Z72.0  Asymptomatic Patient yes  Counseling NA; Former smoker  Former Smokers:   Discussed the importance of maintaining cigarette abstinence. yes  Diagnosis Code: Personal History of Nicotine Dependence. [id number]  Information about tobacco cessation classes and interventions provided to patient. Yes  Patient provided with "ticket" for LDCT Scan. yes  Written Order for Lung Cancer Screening  with LDCT placed in Epic. Yes (CT Chest Lung Cancer Screening Low Dose W/O CM) [id number] Z12.2-Screening of respiratory organs Z87.891-Personal history of nicotine dependence   I spent 20 minutes of face to face time with Mr. Marett discussing the risks and benefits of lung cancer screening. We viewed a power point together that explained in detail the above noted topics. We took the time to pause the power point at intervals to allow for questions to be asked and answered to ensure understanding. We discussed that he had taken the single most powerful action possible to decrease his risk of developing lung cancer when he quit smoking. I counseled him to remain smoke free, and to contact me if he ever had the desire to smoke again so that I can provide resources and tools to help support the effort to remain smoke free. We discussed the time and location of the scan, and that either [place] or I will call with the results within  24-48 hours of receiving them. Mr. Hearn  has my card and contact information in the event he needs to speak with me, in addition to a copy of the power point we reviewed as a resource. He  verbalized understanding of all of the above and had no further questions upon leaving the office.         Magdalen Spatz, AGACNP-BC       [facility]       11/19/2015

## 2015-11-20 ENCOUNTER — Telehealth: Payer: Self-pay | Admitting: Acute Care

## 2015-11-20 DIAGNOSIS — Z87891 Personal history of nicotine dependence: Secondary | ICD-10-CM

## 2015-11-20 NOTE — Telephone Encounter (Signed)
I have called [name] with the results of his low-dose CT lung cancer screening. I explained to him that his scan was read as a Lung RADS 1, negative study: no nodules or definitely benign nodules. Radiology recommendation is for a repeat LDCT in 12 months. He verbalized understanding and had no further questions. I also explained to him, as we had discussed at our  appointment yesterday, that there was a finding of aortic atherosclerosis with multivessel coronary artery calcification. I explained that severity of disease cannot be determined by this non-gated exam. Adam Choi is currently taking Zocor 20 mg daily per his primary care physician Dr. Carolann Littler. I explained to Adam Choi that I will fax a copy of the CT to Dr. Elease Hashimoto in order that he can follow up clinically as he feels is appropriate as he knows this patient's medical history well. Adam Choi verbalized understanding of the above and had no further questions at the completion of the phone call.

## 2015-11-22 ENCOUNTER — Other Ambulatory Visit: Payer: Self-pay

## 2015-11-22 MED ORDER — GABAPENTIN 300 MG PO CAPS
300.0000 mg | ORAL_CAPSULE | Freq: Every day | ORAL | 1 refills | Status: DC
Start: 2015-11-22 — End: 2016-05-25

## 2015-11-22 NOTE — Telephone Encounter (Signed)
I called patient and left message for him to consider setting up follow up to discuss results in more detail- if he wishes.

## 2015-11-23 ENCOUNTER — Telehealth: Payer: Self-pay | Admitting: Family Medicine

## 2015-11-23 NOTE — Telephone Encounter (Signed)
Adam Choi pt would like to have a clear explanation of his results.

## 2015-11-27 NOTE — Telephone Encounter (Signed)
Left message for patient to call back  

## 2015-11-30 NOTE — Telephone Encounter (Signed)
Spoke with patient. He had some questions about the results of his Chest and lung CT. Dr. Elease Hashimoto said that it would be best if he came back in for a follow up to discuss if needs further testing. Patient was happy with that and said he would set that appointment up on mychart.

## 2015-12-04 ENCOUNTER — Ambulatory Visit (INDEPENDENT_AMBULATORY_CARE_PROVIDER_SITE_OTHER): Payer: Medicare Other | Admitting: Family Medicine

## 2015-12-04 ENCOUNTER — Encounter: Payer: Self-pay | Admitting: Family Medicine

## 2015-12-04 VITALS — Temp 98.3°F | Ht 70.5 in | Wt 204.0 lb

## 2015-12-04 DIAGNOSIS — M5417 Radiculopathy, lumbosacral region: Secondary | ICD-10-CM | POA: Diagnosis not present

## 2015-12-04 DIAGNOSIS — I2510 Atherosclerotic heart disease of native coronary artery without angina pectoris: Secondary | ICD-10-CM

## 2015-12-04 DIAGNOSIS — M5416 Radiculopathy, lumbar region: Secondary | ICD-10-CM

## 2015-12-04 DIAGNOSIS — I10 Essential (primary) hypertension: Secondary | ICD-10-CM

## 2015-12-04 DIAGNOSIS — Z23 Encounter for immunization: Secondary | ICD-10-CM

## 2015-12-04 NOTE — Progress Notes (Signed)
Subjective:     Patient ID: Adam Choi., male   DOB: 1945-10-15, 70 y.o.   MRN: [id number]  HPI Patient seen for following several issues  Right lumbar radiculitis symptoms. This was starting to interfere with his sleep. We started gabapentin and he is seeing great improvement. He is sleeping much better and much less night pain. He is currently well controlled with 300 mg at night. No significant daytime somnolence.  Hypertension treated with lisinopril and has been stable. No dizziness. No chest pains.  Patient had recent low-dose CT scan for lung cancer. He did not have any worrisome chest mass but did have noted calcification of the left anterior descending and left circumflex vessels. He is here to discuss that today. He currently walks about 2 miles per day briskly without difficulty. No family history of premature CAD. No exertional dyspnea. He has not had any prior stress testing. Does not take aspirin. Hyperlipidemia treated with simvastatin  Past Medical History:  Diagnosis Date  . Arthritis   . Chicken pox   . Colon polyp 2012  . Fainting spell   . GERD (gastroesophageal reflux disease)    Past Surgical History:  Procedure Laterality Date  . FOOT SURGERY  2013   halux rigidus/bones spur    reports that he quit smoking about 7 years ago. His smoking use included Cigarettes. He has a 30.00 pack-year smoking history. He has never used smokeless tobacco. His alcohol and drug histories are not on file. family history includes Cancer in his mother; Diabetes in his mother; Emphysema in his father; Stroke in his mother. No Known Allergies   Review of Systems  Constitutional: Negative for fatigue and unexpected weight change.  Eyes: Negative for visual disturbance.  Respiratory: Negative for cough, chest tightness and shortness of breath.   Cardiovascular: Negative for chest pain, palpitations and leg swelling.  Neurological: Negative for dizziness, syncope, weakness,  light-headedness and headaches.       Objective:   Physical Exam  Constitutional: He appears well-developed and well-nourished.  Neck: Neck supple. No thyromegaly present.  Cardiovascular: Normal rate and regular rhythm.   Pulmonary/Chest: Effort normal and breath sounds normal. No respiratory distress. He has no wheezes. He has no rales.  Musculoskeletal: He exhibits no edema.  Neurological: He is alert.       Assessment:     #1 recent noted coronary calcification LAD and left circumflex vessels on low-dose CT lung cancer screening. We explained this is a common finding at his age and fairly nonspecific but does correlate with high risk of significant CAD. He is currently asymptomatic  #2 hypertension stable and at goal  #3 right lumbar radiculitis symptoms improved on gabapentin    Plan:     -We discussed pros and cons of further stress testing of the heart. We have encouraged him to consider exercise nuclear stress test and at this point he is undecided. He is currently asymptomatic. He is encouraged to start baby aspirin 1 daily. He has no known contraindications. -Continue current medications -Flu vaccine given  Eulas Post MD [facility] at [facility]

## 2015-12-04 NOTE — Patient Instructions (Signed)
Consider baby aspirin 81 mg once daily Consider nuclear stress test to evaluate coronary arteries.

## 2016-05-08 ENCOUNTER — Other Ambulatory Visit: Payer: Self-pay | Admitting: Family Medicine

## 2016-05-25 ENCOUNTER — Other Ambulatory Visit: Payer: Self-pay | Admitting: Family Medicine

## 2016-08-11 ENCOUNTER — Ambulatory Visit (INDEPENDENT_AMBULATORY_CARE_PROVIDER_SITE_OTHER): Payer: Medicare Other | Admitting: Family Medicine

## 2016-08-11 ENCOUNTER — Encounter: Payer: Self-pay | Admitting: Family Medicine

## 2016-08-11 VITALS — BP 110/78 | HR 67 | Temp 98.4°F | Wt 199.4 lb

## 2016-08-11 DIAGNOSIS — R55 Syncope and collapse: Secondary | ICD-10-CM | POA: Diagnosis not present

## 2016-08-11 NOTE — Patient Instructions (Signed)
Syncope Syncope is when you temporarily lose consciousness. Syncope may also be called fainting or passing out. It is caused by a sudden decrease in blood flow to the brain. Even though most causes of syncope are not dangerous, syncope can be a sign of a serious medical problem. Signs that you may be about to faint include:  Feeling dizzy or light-headed.  Feeling nauseous.  Seeing all white or all black in your field of vision.  Having cold, clammy skin. If you fainted, get medical help right away.Call your local emergency services (911 in the [place] Do not drive yourself to the hospital. Follow these instructions at home: Pay attention to any changes in your symptoms. Take these actions to help with your condition:  Have someone stay with you until you feel stable.  Do not drive, use machinery, or play sports until your health care provider says it is okay.  Keep all follow-up visits as told by your health care provider. This is important.  If you start to feel like you might faint, lie down right away and raise (elevate) your feet above the level of your heart. Breathe deeply and steadily. Wait until all of the symptoms have passed.  Drink enough fluid to keep your urine clear or pale yellow.  If you are taking blood pressure or heart medicine, get up slowly and take several minutes to sit and then stand. This can reduce dizziness.  Take over-the-counter and prescription medicines only as told by your health care provider. Get help right away if:  You have a severe headache.  You have unusual pain in your chest, abdomen, or back.  You are bleeding from your mouth or rectum, or you have black or tarry stool.  You have a very fast or irregular heartbeat (palpitations).  You have pain with breathing.  You faint once or repeatedly.  You have a seizure.  You are confused.  You have trouble walking.  You have severe weakness.  You have vision problems. These symptoms  may represent a serious problem that is an emergency. Do not wait to see if your symptoms will go away. Get medical help right away. Call your local emergency services (911 in the [place] Do not drive yourself to the hospital. This information is not intended to replace advice given to you by your health care provider. Make sure you discuss any questions you have with your health care provider. Document Released: 03/17/2005 Document Revised: 08/23/2015 Document Reviewed: 11/29/2014 Elsevier Interactive Patient Education  2017 [employer]  Reduce Lisinopril 20 mg to one half tablet daily We will set up echocardiogram of the heart Start drinking water early in AM after first getting up.

## 2016-08-11 NOTE — Progress Notes (Signed)
Subjective:     Patient ID: Adam Choi., male   DOB: 1945-10-14, 71 y.o.   MRN: [id number]  HPI  Patient seen following brief syncopal episode this morning. He states he has a history of "postural syncope "with episode about 10 years ago but has had no syncope since then until this morning. This morning went to the toilet and after using the bathroom and walked to the kitchen. Couple minutes after he got in the kitchen recalls feeling very lightheaded followed by syncopal episode. When he came back around he knew immediately where he was. Full recollection of events. No urine or stool incontinence. He felt somewhat "sweaty" overall. No recent chest pain. No focal weakness. No confusion. No postictal type symptoms. No history of seizures.  He does have history of hypertension and takes lisinopril 20 mgs daily for that and took usual dosage the night before.  He states had couple episodes in recent years of feeling very dizzy [place] in the [place] but no other syncope episodes other than the episode 10 years ago. Generally hydrates well during the day.  Denies any recent palpitations or chest pain. He has no dizziness whatsoever at this time. No headache. No confusion.  Past Medical History:  Diagnosis Date  . Arthritis   . Chicken pox   . Colon polyp 2012  . Fainting spell   . GERD (gastroesophageal reflux disease)    Past Surgical History:  Procedure Laterality Date  . FOOT SURGERY  2013   halux rigidus/bones spur    reports that he quit smoking about 8 years ago. His smoking use included Cigarettes. He has a 30.00 pack-year smoking history. He has never used smokeless tobacco. His alcohol and drug histories are not on file. family history includes Cancer in his mother; Diabetes in his mother; Emphysema in his father; Stroke in his mother. No Known Allergies  Review of Systems  Constitutional: Negative for fatigue.  Eyes: Negative for visual disturbance.   Respiratory: Negative for cough, chest tightness and shortness of breath.   Cardiovascular: Negative for chest pain, palpitations and leg swelling.  Gastrointestinal: Negative for abdominal pain, diarrhea, nausea and vomiting.  Genitourinary: Negative for dysuria.  Neurological: Positive for dizziness and syncope. Negative for weakness, light-headedness and headaches.  Psychiatric/Behavioral: Negative for confusion.       Objective:   Physical Exam  Constitutional: He is oriented to person, place, and time. He appears well-developed and well-nourished.  HENT:  Small area of ecchymosis right parietal area. Nontender  Eyes: Pupils are equal, round, and reactive to light.  Neck: Neck supple.  No carotid bruits  Cardiovascular: Normal rate and regular rhythm.  Exam reveals no gallop.   Pulmonary/Chest: Effort normal and breath sounds normal. No respiratory distress. He has no wheezes. He has no rales.  Musculoskeletal: He exhibits no edema.  Neurological: He is alert and oriented to person, place, and time. No cranial nerve deficit. Coordination normal.  Full strength throughout. Normal gait.  Psychiatric: He has a normal mood and affect. His behavior is normal. Judgment and thought content normal.       Assessment:     Syncopal episode this morning. Question postural syncope. Blood pressure stable at this point with 108/60 seated and 110/62 standing.Clinically, doubt seizure.    Plan:     -Check EKG-Mild sinus bradycardia with heart rate 54 with right bundle branch block. No other concerns -Consider echocardiogram and event monitor -Start hydrating with water early in the  morning -Consider decrease lisinopril 10 mg daily -Follow-up in 2 weeks and reassess at that point.  Follow-up immediately for any recurrent syncopal episodes or new symptoms  Eulas Post MD [facility] at [facility]

## 2016-08-18 ENCOUNTER — Encounter: Payer: Self-pay | Admitting: Family Medicine

## 2016-08-26 ENCOUNTER — Ambulatory Visit: Payer: Medicare Other | Admitting: Family Medicine

## 2016-08-27 ENCOUNTER — Other Ambulatory Visit: Payer: Self-pay

## 2016-08-27 ENCOUNTER — Ambulatory Visit (HOSPITAL_COMMUNITY): Payer: Medicare Other | Attending: Family Medicine

## 2016-08-27 DIAGNOSIS — R55 Syncope and collapse: Secondary | ICD-10-CM | POA: Diagnosis not present

## 2016-08-27 DIAGNOSIS — I119 Hypertensive heart disease without heart failure: Secondary | ICD-10-CM | POA: Diagnosis not present

## 2016-08-28 ENCOUNTER — Other Ambulatory Visit (HOSPITAL_COMMUNITY): Payer: Medicare Other

## 2016-09-08 ENCOUNTER — Encounter: Payer: Self-pay | Admitting: Family Medicine

## 2016-09-08 ENCOUNTER — Other Ambulatory Visit: Payer: Self-pay | Admitting: Family Medicine

## 2016-09-08 ENCOUNTER — Ambulatory Visit (INDEPENDENT_AMBULATORY_CARE_PROVIDER_SITE_OTHER): Payer: Medicare Other | Admitting: Family Medicine

## 2016-09-08 VITALS — BP 110/70 | HR 86 | Temp 98.3°F | Wt 200.8 lb

## 2016-09-08 DIAGNOSIS — R55 Syncope and collapse: Secondary | ICD-10-CM

## 2016-09-08 DIAGNOSIS — E785 Hyperlipidemia, unspecified: Secondary | ICD-10-CM | POA: Diagnosis not present

## 2016-09-08 DIAGNOSIS — I10 Essential (primary) hypertension: Secondary | ICD-10-CM | POA: Diagnosis not present

## 2016-09-08 DIAGNOSIS — Z125 Encounter for screening for malignant neoplasm of prostate: Secondary | ICD-10-CM

## 2016-09-08 MED ORDER — LISINOPRIL 10 MG PO TABS: 10.0000 mg | ORAL_TABLET | Freq: Every day | ORAL | 3 refills | Status: DC

## 2016-09-08 NOTE — Progress Notes (Signed)
Subjective:     Patient ID: Adam Choi., male   DOB: [birth year], 71 y.o.   MRN: [id number]  HPI Patient here following recent syncopal episode. We suspected postural syncope. Reduced his lisinopril to 10 mg daily had no further dizziness whatsoever. He feels well this time. No recent chest pains. He is also making an effort to drink water early in the mornings. Other medical problems include history of hyperlipidemia. He is due for repeat lab work and like to schedule. He is not fasting this morning.  Recent echocardiogram revealed grade 1 diastolic dysfunction but no other significant abnormalities. EKG showed no acute abnormalities.  Past Medical History:  Diagnosis Date  . Arthritis   . Chicken pox   . Colon polyp 2012  . Fainting spell   . GERD (gastroesophageal reflux disease)    Past Surgical History:  Procedure Laterality Date  . FOOT SURGERY  2013   halux rigidus/bones spur    reports that he quit smoking about 8 years ago. His smoking use included Cigarettes. He has a 30.00 pack-year smoking history. He has never used smokeless tobacco. His alcohol and drug histories are not on file. family history includes Cancer in his mother; Diabetes in his mother; Emphysema in his father; Stroke in his mother. No Known Allergies   Review of Systems  Constitutional: Negative for fatigue.  Eyes: Negative for visual disturbance.  Respiratory: Negative for cough, chest tightness and shortness of breath.   Cardiovascular: Negative for chest pain, palpitations and leg swelling.  Gastrointestinal: Negative for abdominal pain.  Genitourinary: Negative for dysuria.  Neurological: Negative for dizziness, syncope, weakness, light-headedness and headaches.       Objective:   Physical Exam  Constitutional: He is oriented to person, place, and time. He appears well-developed and well-nourished.  HENT:  Right Ear: External ear normal.  Left Ear: External ear normal.  Mouth/Throat:  Oropharynx is clear and moist.  Eyes: Pupils are equal, round, and reactive to light.  Neck: Neck supple. No thyromegaly present.  Cardiovascular: Normal rate and regular rhythm.   Pulmonary/Chest: Effort normal and breath sounds normal. No respiratory distress. He has no wheezes. He has no rales.  Musculoskeletal: He exhibits no edema.  Neurological: He is alert and oriented to person, place, and time.       Assessment:     #1 recent syncopal episode. Suspected postural syncope. No further episodes and unremarkable echocardiogram as above  #2 hypertension stable and at goal  #3 dyslipidemia    Plan:     -schedule future labs with basic metabolic panel, hepatic panel, lipid panel -Refilled lisinopril 10 mg daily -Continue to hydrate well starting early in the mornings -Follow-up immediately for any recurrent dizziness or syncope. -Routine follow-up in 6 months and sooner as needed  Eulas Post MD [facility] at [facility]

## 2016-09-11 ENCOUNTER — Encounter: Payer: Self-pay | Admitting: Family Medicine

## 2016-09-11 ENCOUNTER — Other Ambulatory Visit (INDEPENDENT_AMBULATORY_CARE_PROVIDER_SITE_OTHER): Payer: Medicare Other

## 2016-09-11 DIAGNOSIS — I10 Essential (primary) hypertension: Secondary | ICD-10-CM | POA: Diagnosis not present

## 2016-09-11 DIAGNOSIS — E785 Hyperlipidemia, unspecified: Secondary | ICD-10-CM

## 2016-09-11 DIAGNOSIS — Z125 Encounter for screening for malignant neoplasm of prostate: Secondary | ICD-10-CM | POA: Diagnosis not present

## 2016-09-11 LAB — HEPATIC FUNCTION PANEL
ALT: 10 U/L (ref 0–53)
AST: 13 U/L (ref 0–37)
Albumin: 4.1 g/dL (ref 3.5–5.2)
Alkaline Phosphatase: 67 U/L (ref 39–117)
Bilirubin, Direct: 0.2 mg/dL (ref 0.0–0.3)
Total Bilirubin: 0.9 mg/dL (ref 0.2–1.2)
Total Protein: 6.6 g/dL (ref 6.0–8.3)

## 2016-09-11 LAB — BASIC METABOLIC PANEL
BUN: 12 mg/dL (ref 6–23)
CHLORIDE: 102 meq/L (ref 96–112)
CO2: 29 mEq/L (ref 19–32)
Calcium: 9.8 mg/dL (ref 8.4–10.5)
Creatinine, Ser: 1.19 mg/dL (ref 0.40–1.50)
GFR: 64.33 mL/min (ref >60.00)
GLUCOSE: 90 mg/dL (ref 70–99)
POTASSIUM: 4.5 meq/L (ref 3.5–5.1)
Sodium: 136 mEq/L (ref 135–145)

## 2016-09-11 LAB — LIPID PANEL
Cholesterol: 173 mg/dL (ref 0–200)
HDL: 63 mg/dL (ref >39.00)
LDL CALC: 87 mg/dL (ref 0–99)
NONHDL: 109.61
Total CHOL/HDL Ratio: 3
Triglycerides: 111 mg/dL (ref 0.0–149.0)
VLDL: 22.2 mg/dL (ref 0.0–40.0)

## 2016-09-11 LAB — PSA: PSA: 0.51 ng/mL (ref 0.10–4.00)

## 2016-11-20 ENCOUNTER — Ambulatory Visit (INDEPENDENT_AMBULATORY_CARE_PROVIDER_SITE_OTHER)
Admission: RE | Admit: 2016-11-20 | Discharge: 2016-11-20 | Disposition: A | Discharge status: Home / Self Care | Payer: Medicare Other | Source: Ambulatory Visit | Attending: Acute Care | Admitting: Acute Care

## 2016-11-20 DIAGNOSIS — Z87891 Personal history of nicotine dependence: Secondary | ICD-10-CM

## 2016-11-25 ENCOUNTER — Other Ambulatory Visit: Payer: Self-pay | Admitting: Acute Care

## 2016-11-25 ENCOUNTER — Encounter: Payer: Self-pay | Admitting: Family Medicine

## 2016-11-25 DIAGNOSIS — Z87891 Personal history of nicotine dependence: Secondary | ICD-10-CM

## 2016-11-25 DIAGNOSIS — Z122 Encounter for screening for malignant neoplasm of respiratory organs: Secondary | ICD-10-CM

## 2016-12-18 ENCOUNTER — Encounter: Payer: Self-pay | Admitting: Family Medicine

## 2017-01-04 ENCOUNTER — Other Ambulatory Visit: Payer: Self-pay | Admitting: Family Medicine

## 2017-03-06 ENCOUNTER — Other Ambulatory Visit: Payer: Self-pay | Admitting: Pharmacist

## 2017-03-06 NOTE — Patient Outreach (Signed)
Outreach call to [employer]. regarding his request for follow up from the [facility] Medication Adherence Campaign.  Adam Choi reports that he takes his simvastatin as directed. Denies any barriers to medication adherence. Patient denies any medication questions at this time.  Harlow Asa, PharmD, [facility] [phone]

## 2017-03-09 ENCOUNTER — Other Ambulatory Visit: Payer: Self-pay | Admitting: Family Medicine

## 2017-04-06 ENCOUNTER — Other Ambulatory Visit: Payer: Self-pay | Admitting: Family Medicine

## 2017-06-01 ENCOUNTER — Encounter: Payer: Self-pay | Admitting: Family Medicine

## 2017-06-01 ENCOUNTER — Ambulatory Visit: Payer: Medicare Other | Admitting: Family Medicine

## 2017-06-01 ENCOUNTER — Other Ambulatory Visit: Payer: Self-pay | Admitting: *Deleted

## 2017-06-01 VITALS — BP 110/60 | HR 87 | Temp 98.7°F | Wt 205.2 lb

## 2017-06-01 DIAGNOSIS — B9789 Other viral agents as the cause of diseases classified elsewhere: Secondary | ICD-10-CM | POA: Diagnosis not present

## 2017-06-01 DIAGNOSIS — J069 Acute upper respiratory infection, unspecified: Secondary | ICD-10-CM | POA: Diagnosis not present

## 2017-06-01 MED ORDER — GABAPENTIN 600 MG PO TABS: 600.0000 mg | ORAL_TABLET | Freq: Every day | ORAL | 1 refills | Status: DC

## 2017-06-01 NOTE — Progress Notes (Signed)
Subjective:     Patient ID: Adam Choi., male   DOB: April 03, 1945, 72 y.o.   MRN: [id number]  HPI Patient seen with onset over the weekend of nasal congestion, cough, sore throat. Denies any myalgias. No fever. No chills. No nausea or vomiting. His wife has been battling a respiratory illness for couple weeks now. Cough is relatively mild  Past Medical History:  Diagnosis Date  . Arthritis   . Chicken pox   . Colon polyp 2012  . Fainting spell   . GERD (gastroesophageal reflux disease)    Past Surgical History:  Procedure Laterality Date  . FOOT SURGERY  2013   halux rigidus/bones spur    reports that he quit smoking about 9 years ago. His smoking use included cigarettes. He has a 30.00 pack-year smoking history. he has never used smokeless tobacco. His alcohol and drug histories are not on file. family history includes Cancer in his mother; Diabetes in his mother; Emphysema in his father; Stroke in his mother. No Known Allergies   Review of Systems  Constitutional: Negative for chills and fever.  HENT: Positive for congestion and sore throat.   Respiratory: Positive for cough. Negative for shortness of breath and wheezing.        Objective:   Physical Exam  Constitutional: He appears well-developed and well-nourished.  HENT:  Right Ear: External ear normal.  Left Ear: External ear normal.  Mouth/Throat: Oropharynx is clear and moist.  Neck: Neck supple.  Cardiovascular: Normal rate and regular rhythm.  Pulmonary/Chest: Effort normal and breath sounds normal. No respiratory distress. He has no wheezes. He has no rales.  Lymphadenopathy:    He has no cervical adenopathy.       Assessment:     Viral URI with cough. Nonfocal exam    Plan:     -Did not test for influenza since he has no fever for myalgias -Treat symptomatically with over-the-counter cough medication -Follow-up immediately for any fever or increasing shortness of breath  Eulas Post  MD [facility] at [facility]

## 2017-06-01 NOTE — Patient Instructions (Signed)

## 2017-06-01 NOTE — Telephone Encounter (Signed)
mr Adam Choi went to see dr Alvan Dame.  dr Alvan Dame told him he could increase his gabapentin 300mg  1 at bedtime.  he has been taking 2 caps at bedtime and it seems to be doing okay.  okay to send in a prescription for either a 600 mg or 2 caps at bedtime?   Okay per Dr Elease Hashimoto

## 2017-09-25 ENCOUNTER — Telehealth: Payer: Self-pay | Admitting: Acute Care

## 2017-09-28 ENCOUNTER — Other Ambulatory Visit: Payer: Self-pay | Admitting: Family Medicine

## 2017-09-28 NOTE — Telephone Encounter (Signed)
LMTC x 1  

## 2017-09-28 NOTE — Telephone Encounter (Signed)
Spoke with pt.  His wife is having hip surgery when is f/u CT is due in 10/2017.  Pt wants to wait until his wife gets settled to have his CT and will call us back in 11/2017 to schedule.  Nothing further needed.

## 2017-12-03 ENCOUNTER — Telehealth: Payer: Self-pay | Admitting: Acute Care

## 2017-12-03 NOTE — Telephone Encounter (Signed)
See 6/28 phone note- pt calling to schedule annual LDCT.  Denise please advise.  Thanks!

## 2017-12-04 NOTE — Telephone Encounter (Signed)
Patient has been scheduled for LCS Ct on 12/17/2017 @9 :30am and is aware of this appt

## 2017-12-04 NOTE — Telephone Encounter (Signed)
Adam Choi, Pt is ready to get scheduled for his f/u LDCT.  Can you call pt to get this scheduled?

## 2017-12-07 ENCOUNTER — Telehealth: Payer: Self-pay | Admitting: Family Medicine

## 2017-12-07 NOTE — Telephone Encounter (Signed)
OK to check lipid, hepatic, CBC, TSH, BMP, PSA in advance of physical.

## 2017-12-07 NOTE — Telephone Encounter (Signed)
Pt is in the office scheduling his Annual Physical and would like to have his lab work done 1 week prior to his Annual on 12/25/17 may I have orders please if you are allowing him to have prior.

## 2017-12-08 ENCOUNTER — Other Ambulatory Visit: Payer: Self-pay | Admitting: Family Medicine

## 2017-12-08 DIAGNOSIS — Z0000 Encounter for general adult medical examination without abnormal findings: Secondary | ICD-10-CM

## 2017-12-08 NOTE — Telephone Encounter (Signed)
Patient has scheduled a lab appointment for 12/10/17 at 8:00am

## 2017-12-08 NOTE — Telephone Encounter (Signed)
Orders have been placed.    Patient notified.

## 2017-12-10 ENCOUNTER — Other Ambulatory Visit (INDEPENDENT_AMBULATORY_CARE_PROVIDER_SITE_OTHER): Payer: Medicare Other

## 2017-12-10 DIAGNOSIS — Z0000 Encounter for general adult medical examination without abnormal findings: Secondary | ICD-10-CM

## 2017-12-10 LAB — CBC WITH DIFFERENTIAL/PLATELET
BASOS PCT: 1.3 % (ref 0.0–3.0)
Basophils Absolute: 0.1 10*3/uL (ref 0.0–0.1)
EOS PCT: 2.2 % (ref 0.0–5.0)
Eosinophils Absolute: 0.1 10*3/uL (ref 0.0–0.7)
HCT: 43.6 % (ref 39.0–52.0)
Hemoglobin: 14.8 g/dL (ref 13.0–17.0)
LYMPHS ABS: 1.8 10*3/uL (ref 0.7–4.0)
Lymphocytes Relative: 28.8 % (ref 12.0–46.0)
MCHC: 34.1 g/dL (ref 30.0–36.0)
MCV: 89.8 fl (ref 78.0–100.0)
MONO ABS: 0.6 10*3/uL (ref 0.1–1.0)
MONOS PCT: 10.4 % (ref 3.0–12.0)
NEUTROS ABS: 3.5 10*3/uL (ref 1.4–7.7)
NEUTROS PCT: 57.3 % (ref 43.0–77.0)
PLATELETS: 205 10*3/uL (ref 150.0–400.0)
RBC: 4.85 Mil/uL (ref 4.22–5.81)
RDW: 14.1 % (ref 11.5–15.5)
WBC: 6.1 10*3/uL (ref 4.0–10.5)

## 2017-12-10 LAB — PSA: PSA: 0.69 ng/mL (ref 0.10–4.00)

## 2017-12-10 LAB — LIPID PANEL
CHOLESTEROL: 177 mg/dL (ref 0–200)
HDL: 60.9 mg/dL (ref >39.00)
LDL Cholesterol: 90 mg/dL (ref 0–99)
NonHDL: 115.71
Total CHOL/HDL Ratio: 3
Triglycerides: 130 mg/dL (ref 0.0–149.0)
VLDL: 26 mg/dL (ref 0.0–40.0)

## 2017-12-10 LAB — BASIC METABOLIC PANEL
BUN: 11 mg/dL (ref 6–23)
CHLORIDE: 100 meq/L (ref 96–112)
CO2: 30 mEq/L (ref 19–32)
CREATININE: 1.23 mg/dL (ref 0.40–1.50)
Calcium: 10.1 mg/dL (ref 8.4–10.5)
GFR: 61.7 mL/min (ref >60.00)
GLUCOSE: 89 mg/dL (ref 70–99)
POTASSIUM: 4.5 meq/L (ref 3.5–5.1)
Sodium: 137 mEq/L (ref 135–145)

## 2017-12-10 LAB — TSH: TSH: 1.57 u[IU]/mL (ref 0.35–4.50)

## 2017-12-10 LAB — HEPATIC FUNCTION PANEL
ALK PHOS: 63 U/L (ref 39–117)
ALT: 12 U/L (ref 0–53)
AST: 13 U/L (ref 0–37)
Albumin: 4.2 g/dL (ref 3.5–5.2)
Bilirubin, Direct: 0.2 mg/dL (ref 0.0–0.3)
TOTAL PROTEIN: 6.9 g/dL (ref 6.0–8.3)
Total Bilirubin: 1 mg/dL (ref 0.2–1.2)

## 2017-12-17 ENCOUNTER — Ambulatory Visit (INDEPENDENT_AMBULATORY_CARE_PROVIDER_SITE_OTHER)
Admission: RE | Admit: 2017-12-17 | Discharge: 2017-12-17 | Disposition: A | Discharge status: Home / Self Care | Payer: Medicare Other | Source: Ambulatory Visit | Attending: Acute Care | Admitting: Acute Care

## 2017-12-17 DIAGNOSIS — Z87891 Personal history of nicotine dependence: Secondary | ICD-10-CM

## 2017-12-17 DIAGNOSIS — Z122 Encounter for screening for malignant neoplasm of respiratory organs: Secondary | ICD-10-CM

## 2017-12-22 ENCOUNTER — Telehealth: Payer: Self-pay | Admitting: Acute Care

## 2017-12-22 DIAGNOSIS — Z87891 Personal history of nicotine dependence: Secondary | ICD-10-CM

## 2017-12-22 DIAGNOSIS — Z122 Encounter for screening for malignant neoplasm of respiratory organs: Secondary | ICD-10-CM

## 2017-12-22 NOTE — Telephone Encounter (Signed)
Called and spoke to pt.  Pt stated he is returning denise's call for CT results.  Denise please advise. Thanks

## 2017-12-22 NOTE — Telephone Encounter (Signed)
Pt informed of CT results per Sarah Groce, NP.  PT verbalized understanding.  Copy sent to PCP.  Order placed for 1 yr f/u CT.  

## 2017-12-23 ENCOUNTER — Other Ambulatory Visit: Payer: Self-pay | Admitting: Family Medicine

## 2017-12-25 ENCOUNTER — Ambulatory Visit (INDEPENDENT_AMBULATORY_CARE_PROVIDER_SITE_OTHER): Payer: Medicare Other | Admitting: Family Medicine

## 2017-12-25 ENCOUNTER — Other Ambulatory Visit: Payer: Self-pay

## 2017-12-25 ENCOUNTER — Encounter: Payer: Self-pay | Admitting: Family Medicine

## 2017-12-25 VITALS — BP 116/68 | HR 69 | Temp 98.2°F | Ht 68.5 in | Wt 199.2 lb

## 2017-12-25 DIAGNOSIS — Z23 Encounter for immunization: Secondary | ICD-10-CM

## 2017-12-25 DIAGNOSIS — Z0000 Encounter for general adult medical examination without abnormal findings: Secondary | ICD-10-CM | POA: Diagnosis not present

## 2017-12-25 NOTE — Patient Instructions (Signed)
Consider Shingrix vaccine - check with pharmacy if interested.    

## 2017-12-25 NOTE — Progress Notes (Signed)
Subjective:     Patient ID: Adam Choi., male   DOB: 1945/10/02, 72 y.o.   MRN: [id number]  HPI Patient seen for physical exam.  He has history of osteoarthritis, hypertension, hyperlipidemia.  He has been getting regular CT lung cancer screening.  There was suggestion of emphysema though he has no shortness of breath whatsoever and good activity tolerance.  Also some mention of calcification/ atherosclerotic changes.  No history of known CAD.  Health maintenance reviewed.  Recent labs and these were all unremarkable including PSA.  He thinks he had Zostavax vaccine several years ago.  He has donated blood in the past few years and is at low risk for hepatitis C.  Needs flu vaccine.  Colonoscopy up-to-date.  No consistent exercise.  Past Medical History:  Diagnosis Date  . Arthritis   . Chicken pox   . Colon polyp 2012  . Fainting spell   . GERD (gastroesophageal reflux disease)    Past Surgical History:  Procedure Laterality Date  . FOOT SURGERY  2013   halux rigidus/bones spur    reports that he quit smoking about 9 years ago. His smoking use included cigarettes. He has a 30.00 pack-year smoking history. He has never used smokeless tobacco. His alcohol and drug histories are not on file. family history includes Cancer in his mother; Diabetes in his mother; Emphysema in his father; Stroke in his mother. No Known Allergies   Review of Systems  Constitutional: Negative for activity change, appetite change, fatigue and fever.  HENT: Negative for congestion, ear pain and trouble swallowing.   Eyes: Negative for pain and visual disturbance.  Respiratory: Negative for cough, shortness of breath and wheezing.   Cardiovascular: Negative for chest pain and palpitations.  Gastrointestinal: Negative for abdominal distention, abdominal pain, blood in stool, constipation, diarrhea, nausea, rectal pain and vomiting.  Genitourinary: Negative for dysuria, hematuria and testicular pain.   Musculoskeletal: Negative for arthralgias and joint swelling.  Skin: Negative for rash.  Neurological: Negative for dizziness, syncope and headaches.  Hematological: Negative for adenopathy.  Psychiatric/Behavioral: Negative for confusion and dysphoric mood.       Objective:   Physical Exam  Constitutional: He is oriented to person, place, and time. He appears well-developed and well-nourished. No distress.  HENT:  Head: Normocephalic and atraumatic.  Right Ear: External ear normal.  Left Ear: External ear normal.  Mouth/Throat: Oropharynx is clear and moist.  Eyes: Pupils are equal, round, and reactive to light. Conjunctivae and EOM are normal.  Neck: Normal range of motion. Neck supple. No thyromegaly present.  Cardiovascular: Normal rate, regular rhythm and normal heart sounds.  No murmur heard. Pulmonary/Chest: No respiratory distress. He has no wheezes. He has no rales.  Abdominal: Soft. Bowel sounds are normal. He exhibits no distension and no mass. There is no tenderness. There is no rebound and no guarding.  Musculoskeletal: He exhibits no edema.  Lymphadenopathy:    He has no cervical adenopathy.  Neurological: He is alert and oriented to person, place, and time. He displays normal reflexes. No cranial nerve deficit.  Skin: No rash noted.  Psychiatric: He has a normal mood and affect.       Assessment:     Physical exam.  Health maintenance issues addressed as below    Plan:     -Flu vaccine given -Discussed Shingrix vaccine and he will check with insurance and pharmacy -Reviewed recent labs -repeat colonoscopy by 2022  Eulas Post MD [facility] at  [facility]

## 2018-03-08 ENCOUNTER — Other Ambulatory Visit: Payer: Self-pay | Admitting: Family Medicine

## 2018-05-04 DIAGNOSIS — M65331 Trigger finger, right middle finger: Secondary | ICD-10-CM | POA: Insufficient documentation

## 2018-07-05 ENCOUNTER — Other Ambulatory Visit: Payer: Self-pay | Admitting: Family Medicine

## 2018-07-06 ENCOUNTER — Other Ambulatory Visit: Payer: Self-pay | Admitting: Family Medicine

## 2018-08-16 ENCOUNTER — Encounter: Payer: Self-pay | Admitting: Family Medicine

## 2018-09-27 ENCOUNTER — Other Ambulatory Visit: Payer: Self-pay | Admitting: Family Medicine

## 2018-11-24 ENCOUNTER — Telehealth: Payer: Self-pay | Admitting: Acute Care

## 2018-11-24 NOTE — Telephone Encounter (Signed)
Patient states he received a call from [place] regarding his lung cancer CT   Route message to Rodena Piety and told patient she would call back

## 2018-11-24 NOTE — Telephone Encounter (Signed)
I called Adam Choi back and he is aware that his L[facility] Ct appt is on 12/31/2018 @ 8:30am

## 2018-12-16 ENCOUNTER — Telehealth: Payer: Self-pay | Admitting: Family Medicine

## 2018-12-16 NOTE — Telephone Encounter (Signed)
Pt called in to schedule his CPE, pt says that he has a annual test for smokers with pulmonary and would like to know if PCP would like to have this completed first?   CB: [phone]

## 2018-12-17 NOTE — Telephone Encounter (Signed)
Called patient and let him know that he will get his labs the same day of his physical. Patient stated that he has a CT scan scheduled by pulmonology on 12/31/18 and will discuss with Dr. Elease Hashimoto if he needs to check the results. Patient has his CPE scheduled on 12/28/18. Patient verbalized an understanding.

## 2018-12-28 ENCOUNTER — Ambulatory Visit (INDEPENDENT_AMBULATORY_CARE_PROVIDER_SITE_OTHER): Payer: Medicare Other | Admitting: Family Medicine

## 2018-12-28 ENCOUNTER — Encounter: Payer: Self-pay | Admitting: Family Medicine

## 2018-12-28 ENCOUNTER — Other Ambulatory Visit: Payer: Self-pay

## 2018-12-28 VITALS — BP 130/64 | HR 66 | Temp 96.5°F | Wt 198.2 lb

## 2018-12-28 DIAGNOSIS — Z23 Encounter for immunization: Secondary | ICD-10-CM | POA: Diagnosis not present

## 2018-12-28 DIAGNOSIS — Z0000 Encounter for general adult medical examination without abnormal findings: Secondary | ICD-10-CM

## 2018-12-28 LAB — TSH: TSH: 1.34 u[IU]/mL (ref 0.35–4.50)

## 2018-12-28 LAB — CBC WITH DIFFERENTIAL/PLATELET
Basophils Absolute: 0 10*3/uL (ref 0.0–0.1)
Basophils Relative: 0.7 % (ref 0.0–3.0)
Eosinophils Absolute: 0.1 10*3/uL (ref 0.0–0.7)
Eosinophils Relative: 1.8 % (ref 0.0–5.0)
HCT: 45.1 % (ref 39.0–52.0)
Hemoglobin: 15.2 g/dL (ref 13.0–17.0)
Lymphocytes Relative: 25.1 % (ref 12.0–46.0)
Lymphs Abs: 1.6 10*3/uL (ref 0.7–4.0)
MCHC: 33.7 g/dL (ref 30.0–36.0)
MCV: 93.9 fl (ref 78.0–100.0)
Monocytes Absolute: 0.8 10*3/uL (ref 0.1–1.0)
Monocytes Relative: 12 % (ref 3.0–12.0)
Neutro Abs: 3.8 10*3/uL (ref 1.4–7.7)
Neutrophils Relative %: 60.4 % (ref 43.0–77.0)
Platelets: 221 10*3/uL (ref 150.0–400.0)
RBC: 4.81 Mil/uL (ref 4.22–5.81)
RDW: 13.7 % (ref 11.5–15.5)
WBC: 6.3 10*3/uL (ref 4.0–10.5)

## 2018-12-28 LAB — BASIC METABOLIC PANEL
BUN: 11 mg/dL (ref 6–23)
CO2: 28 mEq/L (ref 19–32)
Calcium: 10.2 mg/dL (ref 8.4–10.5)
Chloride: 99 mEq/L (ref 96–112)
Creatinine, Ser: 1.06 mg/dL (ref 0.40–1.50)
GFR: 68.72 mL/min (ref >60.00)
Glucose, Bld: 83 mg/dL (ref 70–99)
Potassium: 4.5 mEq/L (ref 3.5–5.1)
Sodium: 136 mEq/L (ref 135–145)

## 2018-12-28 LAB — HEPATIC FUNCTION PANEL
ALT: 14 U/L (ref 0–53)
AST: 16 U/L (ref 0–37)
Albumin: 4.5 g/dL (ref 3.5–5.2)
Alkaline Phosphatase: 65 U/L (ref 39–117)
Bilirubin, Direct: 0.2 mg/dL (ref 0.0–0.3)
Total Bilirubin: 0.9 mg/dL (ref 0.2–1.2)
Total Protein: 7 g/dL (ref 6.0–8.3)

## 2018-12-28 LAB — LIPID PANEL
Cholesterol: 161 mg/dL (ref 0–200)
HDL: 73.1 mg/dL (ref >39.00)
LDL Cholesterol: 68 mg/dL (ref 0–99)
NonHDL: 88.05
Total CHOL/HDL Ratio: 2
Triglycerides: 102 mg/dL (ref 0.0–149.0)
VLDL: 20.4 mg/dL (ref 0.0–40.0)

## 2018-12-28 MED ORDER — SIMVASTATIN 20 MG PO TABS: ORAL_TABLET | ORAL | 3 refills | Status: DC

## 2018-12-28 MED ORDER — LISINOPRIL 10 MG PO TABS: 10.0000 mg | ORAL_TABLET | Freq: Every day | ORAL | 3 refills | Status: DC

## 2018-12-28 MED ORDER — GABAPENTIN 600 MG PO TABS: 600.0000 mg | ORAL_TABLET | Freq: Every day | ORAL | 3 refills | Status: DC

## 2018-12-28 NOTE — Progress Notes (Signed)
Subjective:     Patient ID: Adam Paganini., male   DOB: Mar 28, 1946, 73 y.o.   MRN: [id number]  HPI Adam Choi is seen for physical exam.  His chronic problems include history of hypertension, hyperlipidemia, and chronic neuropathic pain treated with gabapentin.  Generally doing well.  [place]  Is walking 2 miles per day for exercise.  Medications reviewed.  Adam Choi needs refills of all.  Health maintenance reviewed.  Adam Choi sees dermatologist yearly for exams.  Adam Choi had small basal cell removed from his leg last year  -Needs flu vaccine -Needs tetanus but is aware that insurance will not likely cover in the absence of injury. -No history of hepatitis C screening.  Low risk. -Due for repeat colonoscopy in 2 years -Pneumonia vaccines complete  No recent falls.  No depression issues.  PHQ 2 equals 0  Past Medical History:  Diagnosis Date  . Arthritis   . Chicken pox   . Colon polyp 2012  . Fainting spell   . GERD (gastroesophageal reflux disease)    Past Surgical History:  Procedure Laterality Date  . FOOT SURGERY  2013   halux rigidus/bones spur    reports that Adam Choi quit smoking about 10 years ago. His smoking use included cigarettes. Adam Choi has a 30.00 pack-year smoking history. Adam Choi has never used smokeless tobacco. No history on file for alcohol and drug. family history includes Cancer in his mother; Diabetes in his mother; Emphysema in his father; Stroke in his mother. No Known Allergies  Review of Systems  Constitutional: Negative for activity change, appetite change, fatigue and fever.  HENT: Negative for congestion, ear pain and trouble swallowing.   Eyes: Negative for pain and visual disturbance.  Respiratory: Negative for cough, shortness of breath and wheezing.   Cardiovascular: Negative for chest pain and palpitations.  Gastrointestinal: Negative for abdominal distention, abdominal pain, blood in stool, constipation, diarrhea, nausea, rectal  pain and vomiting.  Endocrine: Negative for polydipsia and polyuria.  Genitourinary: Negative for dysuria, hematuria and testicular pain.  Musculoskeletal: Negative for arthralgias and joint swelling.  Skin: Negative for rash.  Neurological: Negative for dizziness, syncope and headaches.  Hematological: Negative for adenopathy.  Psychiatric/Behavioral: Negative for confusion and dysphoric mood.       Objective:   Physical Exam Constitutional:      General: Adam Choi is not in acute distress.    Appearance: Adam Choi is well-developed.  HENT:     Head: Normocephalic and atraumatic.     Right Ear: External ear normal.     Left Ear: External ear normal.  Eyes:     Conjunctiva/sclera: Conjunctivae normal.     Pupils: Pupils are equal, round, and reactive to light.  Neck:     Musculoskeletal: Normal range of motion and neck supple.     Thyroid: No thyromegaly.  Cardiovascular:     Rate and Rhythm: Normal rate and regular rhythm.     Heart sounds: Normal heart sounds. No murmur.  Pulmonary:     Effort: No respiratory distress.     Breath sounds: No wheezing or rales.  Abdominal:     General: Bowel sounds are normal. There is no distension.     Palpations: Abdomen is soft. There is no mass.     Tenderness: There is no abdominal tenderness. There is no guarding or rebound.  Musculoskeletal:     Right lower leg: No edema.     Left lower leg: No edema.  Lymphadenopathy:     Cervical: No cervical adenopathy.  Skin:    Findings: No rash.  Neurological:     Mental Status: Adam Choi is alert and oriented to person, place, and time.     Cranial Nerves: No cranial nerve deficit.     Deep Tendon Reflexes: Reflexes normal.        Assessment:     Physical exam.  Generally healthy 73 year old male.  Blood pressure well controlled.  We discussed the following health maintenance issues    Plan:     -Flu vaccine given -Check hepatitis C antibody -Obtain follow-up labs.  We discussed PSA testing in  some detail.  Adam Choi wishes to hold at this time.  Will reconsider in 1 year -Continue regular exercise habits such as walking -Refilled medications for 1 year  Eulas Post MD [facility] at [facility]

## 2018-12-28 NOTE — Addendum Note (Signed)
Addended by: Rebecca Eaton on: 12/28/2018 10:13 AM   Modules accepted: Orders

## 2018-12-29 ENCOUNTER — Encounter: Payer: Self-pay | Admitting: Family Medicine

## 2018-12-29 LAB — HEPATITIS C ANTIBODY
Hepatitis C Ab: NONREACTIVE
SIGNAL TO CUT-OFF: 0.02 (ref <1.00)

## 2018-12-31 ENCOUNTER — Other Ambulatory Visit: Payer: Self-pay

## 2018-12-31 ENCOUNTER — Ambulatory Visit (INDEPENDENT_AMBULATORY_CARE_PROVIDER_SITE_OTHER)
Admission: RE | Admit: 2018-12-31 | Discharge: 2018-12-31 | Disposition: A | Discharge status: Home / Self Care | Payer: Medicare Other | Source: Ambulatory Visit | Attending: Acute Care | Admitting: Acute Care

## 2018-12-31 DIAGNOSIS — Z87891 Personal history of nicotine dependence: Secondary | ICD-10-CM | POA: Diagnosis not present

## 2018-12-31 DIAGNOSIS — Z122 Encounter for screening for malignant neoplasm of respiratory organs: Secondary | ICD-10-CM

## 2019-01-04 ENCOUNTER — Telehealth: Payer: Self-pay | Admitting: Family Medicine

## 2019-01-04 NOTE — Telephone Encounter (Signed)
Please advise 

## 2019-01-04 NOTE — Telephone Encounter (Signed)
Pt would like to only get a tetanus shot without being seen by the doctor.  Pt state that he injured his R index finger on some pliers.  So can the pt just have the shot only?

## 2019-01-04 NOTE — Telephone Encounter (Signed)
Yes-we will just need to document the injury.  I will be happy to see him tomorrow if he would like.

## 2019-01-05 ENCOUNTER — Ambulatory Visit: Payer: Medicare Other

## 2019-01-05 NOTE — Telephone Encounter (Signed)
Left message for patient to call back. CRM created 

## 2019-01-06 ENCOUNTER — Other Ambulatory Visit: Payer: Self-pay | Admitting: *Deleted

## 2019-01-06 DIAGNOSIS — Z87891 Personal history of nicotine dependence: Secondary | ICD-10-CM

## 2019-01-06 DIAGNOSIS — Z122 Encounter for screening for malignant neoplasm of respiratory organs: Secondary | ICD-10-CM

## 2019-01-07 ENCOUNTER — Other Ambulatory Visit: Payer: Self-pay

## 2019-01-07 ENCOUNTER — Encounter: Payer: Self-pay | Admitting: Family Medicine

## 2019-01-07 ENCOUNTER — Ambulatory Visit: Payer: Medicare Other | Admitting: Family Medicine

## 2019-01-07 VITALS — BP 108/68 | HR 87 | Temp 97.9°F | Resp 17 | Ht 71.0 in | Wt 196.3 lb

## 2019-01-07 DIAGNOSIS — IMO0002 Reserved for concepts with insufficient information to code with codable children: Secondary | ICD-10-CM

## 2019-01-07 DIAGNOSIS — S60940A Unspecified superficial injury of right index finger, initial encounter: Secondary | ICD-10-CM

## 2019-01-07 DIAGNOSIS — T148XXA Other injury of unspecified body region, initial encounter: Secondary | ICD-10-CM

## 2019-01-07 DIAGNOSIS — Z23 Encounter for immunization: Secondary | ICD-10-CM

## 2019-01-07 NOTE — Addendum Note (Signed)
Addended by: Caroll Rancher L on: 01/07/2019 09:15 AM   Modules accepted: Orders

## 2019-01-07 NOTE — Progress Notes (Signed)
  Subjective:     Patient ID: Adam Paganini., male   DOB: [birth year], 73 y.o.   MRN: [id number]  HPI Patient is seen with injury from rusty tool which occurred this past Tuesday.  He was using some pliers and his skin basically was pinched right index finger at the base of the finger.  He had minimal bleeding.  No bony injury.  Last tetanus unknown.  He has not noted any swelling or erythema.  Minimal pain.  No fevers or chills.  Past Medical History:  Diagnosis Date  . Arthritis   . Chicken pox   . Colon polyp 2012  . Fainting spell   . GERD (gastroesophageal reflux disease)    Past Surgical History:  Procedure Laterality Date  . FOOT SURGERY  2013   halux rigidus/bones spur    reports that he quit smoking about 10 years ago. His smoking use included cigarettes. He has a 30.00 pack-year smoking history. He has never used smokeless tobacco. No history on file for alcohol and drug. family history includes Cancer in his mother; Diabetes in his mother; Emphysema in his father; Stroke in his mother. No Known Allergies   Review of Systems  Constitutional: Negative for chills and fever.       Objective:   Physical Exam Constitutional:      Appearance: Normal appearance.  Cardiovascular:     Rate and Rhythm: Normal rate and regular rhythm.  Skin:    Comments: Patient has superficial injury right index finger along the lateral surface just distal to the MCP joint.  No surrounding erythema or warmth.  Neurological:     Mental Status: He is alert.        Assessment:     Injury right index finger from rusty pliers.  No signs of secondary infection    Plan:     -Tetanus booster given -Follow-up in 1 year for physical and sooner as needed  Eulas Post MD [facility] at [facility]

## 2019-01-12 NOTE — Telephone Encounter (Signed)
Pt was seen on 01/07/19

## 2019-04-17 DIAGNOSIS — Z20828 Contact with and (suspected) exposure to other viral communicable diseases: Secondary | ICD-10-CM | POA: Diagnosis not present

## 2019-06-21 DIAGNOSIS — D2261 Melanocytic nevi of right upper limb, including shoulder: Secondary | ICD-10-CM | POA: Diagnosis not present

## 2019-06-21 DIAGNOSIS — L821 Other seborrheic keratosis: Secondary | ICD-10-CM | POA: Diagnosis not present

## 2019-06-21 DIAGNOSIS — L738 Other specified follicular disorders: Secondary | ICD-10-CM | POA: Diagnosis not present

## 2019-06-21 DIAGNOSIS — L814 Other melanin hyperpigmentation: Secondary | ICD-10-CM | POA: Diagnosis not present

## 2019-06-21 DIAGNOSIS — D225 Melanocytic nevi of trunk: Secondary | ICD-10-CM | POA: Diagnosis not present

## 2019-06-21 DIAGNOSIS — D1801 Hemangioma of skin and subcutaneous tissue: Secondary | ICD-10-CM | POA: Diagnosis not present

## 2019-06-21 DIAGNOSIS — Z85828 Personal history of other malignant neoplasm of skin: Secondary | ICD-10-CM | POA: Diagnosis not present

## 2019-06-21 DIAGNOSIS — L570 Actinic keratosis: Secondary | ICD-10-CM | POA: Diagnosis not present

## 2019-09-15 ENCOUNTER — Encounter: Payer: Self-pay | Admitting: Family Medicine

## 2019-09-15 DIAGNOSIS — H43393 Other vitreous opacities, bilateral: Secondary | ICD-10-CM | POA: Diagnosis not present

## 2019-09-15 DIAGNOSIS — H35373 Puckering of macula, bilateral: Secondary | ICD-10-CM | POA: Diagnosis not present

## 2019-09-15 DIAGNOSIS — D3131 Benign neoplasm of right choroid: Secondary | ICD-10-CM | POA: Diagnosis not present

## 2019-09-15 DIAGNOSIS — H35033 Hypertensive retinopathy, bilateral: Secondary | ICD-10-CM | POA: Diagnosis not present

## 2019-09-15 DIAGNOSIS — H2513 Age-related nuclear cataract, bilateral: Secondary | ICD-10-CM | POA: Diagnosis not present

## 2019-09-20 ENCOUNTER — Encounter: Payer: Self-pay | Admitting: Family Medicine

## 2019-09-20 MED ORDER — GABAPENTIN 600 MG PO TABS: 900.0000 mg | ORAL_TABLET | Freq: Every day | ORAL | 1 refills | Status: DC

## 2019-09-30 DIAGNOSIS — M545 Low back pain, unspecified: Secondary | ICD-10-CM | POA: Insufficient documentation

## 2019-09-30 DIAGNOSIS — M5136 Other intervertebral disc degeneration, lumbar region: Secondary | ICD-10-CM | POA: Diagnosis not present

## 2020-01-06 ENCOUNTER — Other Ambulatory Visit: Payer: Self-pay | Admitting: Family Medicine

## 2020-01-19 ENCOUNTER — Ambulatory Visit (INDEPENDENT_AMBULATORY_CARE_PROVIDER_SITE_OTHER)
Admission: RE | Admit: 2020-01-19 | Discharge: 2020-01-19 | Disposition: A | Discharge status: Home / Self Care | Payer: Medicare PPO | Source: Ambulatory Visit | Attending: Acute Care | Admitting: Acute Care

## 2020-01-19 ENCOUNTER — Other Ambulatory Visit: Payer: Self-pay

## 2020-01-19 DIAGNOSIS — Z122 Encounter for screening for malignant neoplasm of respiratory organs: Secondary | ICD-10-CM

## 2020-01-19 DIAGNOSIS — Z87891 Personal history of nicotine dependence: Secondary | ICD-10-CM | POA: Diagnosis not present

## 2020-01-26 NOTE — Progress Notes (Signed)
Please call patient and let them  know their  low dose Ct was read as a Lung RADS 2: nodules that are benign in appearance and behavior with a very low likelihood of becoming a clinically active cancer due to size or lack of growth. Recommendation per radiology is for a repeat LDCT in 12 months. .Please let them  know we will order and schedule their  annual screening scan for 12/2020. Please let them  know there was notation of CAD on their  scan.  Please remind the patient  that this is a non-gated exam therefore degree or severity of disease  cannot be determined. Please have them  follow up with their PCP regarding potential risk factor modification, dietary therapy or pharmacologic therapy if clinically indicated. Pt.  is  currently on statin therapy. Please place order for annual  screening scan for  12/2020 and fax results to PCP. Thanks so much. Please have patient follow up with PCP about the cardiac findings on the scan. Thanks so much

## 2020-01-27 ENCOUNTER — Other Ambulatory Visit: Payer: Self-pay | Admitting: *Deleted

## 2020-01-27 DIAGNOSIS — Z87891 Personal history of nicotine dependence: Secondary | ICD-10-CM

## 2020-04-01 ENCOUNTER — Other Ambulatory Visit: Payer: Self-pay | Admitting: Family Medicine

## 2020-04-03 ENCOUNTER — Telehealth: Payer: Self-pay | Admitting: Family Medicine

## 2020-04-03 NOTE — Telephone Encounter (Signed)
Patient needs a refill on Lisinopril per MyChart.  He is almost out.

## 2020-04-05 ENCOUNTER — Other Ambulatory Visit: Payer: Self-pay | Admitting: Family Medicine

## 2020-04-10 ENCOUNTER — Other Ambulatory Visit: Payer: Self-pay | Admitting: Family Medicine

## 2020-04-23 DIAGNOSIS — M1611 Unilateral primary osteoarthritis, right hip: Secondary | ICD-10-CM | POA: Diagnosis not present

## 2020-04-23 DIAGNOSIS — M25552 Pain in left hip: Secondary | ICD-10-CM | POA: Diagnosis not present

## 2020-04-24 ENCOUNTER — Encounter: Payer: Self-pay | Admitting: Family Medicine

## 2020-04-24 ENCOUNTER — Other Ambulatory Visit: Payer: Self-pay

## 2020-04-24 ENCOUNTER — Ambulatory Visit (INDEPENDENT_AMBULATORY_CARE_PROVIDER_SITE_OTHER): Payer: Medicare PPO | Admitting: Family Medicine

## 2020-04-24 VITALS — BP 118/70 | HR 68 | Ht 71.0 in | Wt 191.0 lb

## 2020-04-24 DIAGNOSIS — Z0000 Encounter for general adult medical examination without abnormal findings: Secondary | ICD-10-CM | POA: Diagnosis not present

## 2020-04-24 DIAGNOSIS — Z136 Encounter for screening for cardiovascular disorders: Secondary | ICD-10-CM

## 2020-04-24 LAB — LIPID PANEL
Cholesterol: 158 mg/dL (ref 0–200)
HDL: 62.3 mg/dL (ref >39.00)
LDL Cholesterol: 68 mg/dL (ref 0–99)
NonHDL: 96.17
Total CHOL/HDL Ratio: 3
Triglycerides: 142 mg/dL (ref 0.0–149.0)
VLDL: 28.4 mg/dL (ref 0.0–40.0)

## 2020-04-24 LAB — BASIC METABOLIC PANEL
BUN: 15 mg/dL (ref 6–23)
CO2: 32 mEq/L (ref 19–32)
Calcium: 10.1 mg/dL (ref 8.4–10.5)
Chloride: 100 mEq/L (ref 96–112)
Creatinine, Ser: 1.24 mg/dL (ref 0.40–1.50)
GFR: 57.84 mL/min — ABNORMAL LOW (ref >60.00)
Glucose, Bld: 91 mg/dL (ref 70–99)
Potassium: 4.7 mEq/L (ref 3.5–5.1)
Sodium: 137 mEq/L (ref 135–145)

## 2020-04-24 LAB — CBC WITH DIFFERENTIAL/PLATELET
Basophils Absolute: 0.1 10*3/uL (ref 0.0–0.1)
Basophils Relative: 1.1 % (ref 0.0–3.0)
Eosinophils Absolute: 0.1 10*3/uL (ref 0.0–0.7)
Eosinophils Relative: 1 % (ref 0.0–5.0)
HCT: 46.5 % (ref 39.0–52.0)
Hemoglobin: 15.5 g/dL (ref 13.0–17.0)
Lymphocytes Relative: 27.5 % (ref 12.0–46.0)
Lymphs Abs: 1.5 10*3/uL (ref 0.7–4.0)
MCHC: 33.3 g/dL (ref 30.0–36.0)
MCV: 91.1 fl (ref 78.0–100.0)
Monocytes Absolute: 0.6 10*3/uL (ref 0.1–1.0)
Monocytes Relative: 11.4 % (ref 3.0–12.0)
Neutro Abs: 3.1 10*3/uL (ref 1.4–7.7)
Neutrophils Relative %: 59 % (ref 43.0–77.0)
Platelets: 209 10*3/uL (ref 150.0–400.0)
RBC: 5.1 Mil/uL (ref 4.22–5.81)
RDW: 13.6 % (ref 11.5–15.5)
WBC: 5.3 10*3/uL (ref 4.0–10.5)

## 2020-04-24 LAB — HEPATIC FUNCTION PANEL
ALT: 11 U/L (ref 0–53)
AST: 14 U/L (ref 0–37)
Albumin: 4.5 g/dL (ref 3.5–5.2)
Alkaline Phosphatase: 64 U/L (ref 39–117)
Bilirubin, Direct: 0.2 mg/dL (ref 0.0–0.3)
Total Bilirubin: 1.1 mg/dL (ref 0.2–1.2)
Total Protein: 6.9 g/dL (ref 6.0–8.3)

## 2020-04-24 LAB — TSH: TSH: 1.05 u[IU]/mL (ref 0.35–4.50)

## 2020-04-24 LAB — PSA, MEDICARE: PSA: 0.6 ng/ml (ref 0.10–4.00)

## 2020-04-24 NOTE — Progress Notes (Signed)
Established Patient Office Visit  Subjective:  Patient ID: Adam Shingleshomas Accardo Jr., male    DOB: Mar 02, 1946  Age: 75 y.o. MRN: [id number]  CC:  Chief Complaint  Patient presents with  . Annual Exam    HPI Adam Shingleshomas Bartha Jr. presents for physical exam.  He has history of hypertension, history  of colon polyps, osteoarthritis, hyperlipidemia.  Recently had seen orthopedist.  Severe osteoarthritis right hip.  Is looking at steroid injection but no surgery at this time.  Health maintenance reviewed  -Covid vaccines given including booster -Flu vaccine received -Prior hepatitis C screen negative -Pneumonia vaccines complete -No history of documented shingles vaccine -Tetanus given 2020 -Repeat colonoscopy due 11/22  Family history-Father had emphysema and died of complications of lung disease.  Mom had type 2 diabetes, history of CVA, breast cancer.  He has 2 sisters.  One died of some type of lung disease.  another had lung cancer  Social history-patient is married and has been married 34 years.  This is a second marriage.  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  Past history of smoking.  No regular alcohol use.  Walks some for exercise.  Past Medical History:  Diagnosis Date  . Arthritis   . Chicken pox   . Colon polyp 2012  . Fainting spell   . GERD (gastroesophageal reflux disease)     Past Surgical History:  Procedure Laterality Date  . FOOT SURGERY  2013   halux rigidus/bones spur    Family History  Problem Relation Age of Onset  . Cancer Mother        breast  . Stroke Mother   . Diabetes Mother        type ll  . Emphysema Father   . Cancer Sister [id number]       lung cancer    Social History   Socioeconomic History  . Marital status: Married    Spouse name: Not on file  . Number of children: Not on file  . Years of education: Not on file  . Highest education level: Not on file  Occupational History  . Not on file  Tobacco Use  . Smoking status:  Former Smoker    Packs/day: 1.00    Years: 30.00    Pack years: 30.00    Types: Cigarettes    Quit date: 04/23/2008    Years since quitting: 12.0  . Smokeless tobacco: Never Used  Vaping Use  . Vaping Use: Never used  Substance and Sexual Activity  . Alcohol use: Not on file  . Drug use: Not on file  . Sexual activity: Not on file  Other Topics Concern  . Not on file  Social History Narrative  . Not on file   Social Determinants of Health   Financial Resource Strain: Not on file  Food Insecurity: Not on file  Transportation Needs: Not on file  Physical Activity: Not on file  Stress: Not on file  Social Connections: Not on file  Intimate Partner Violence: Not on file    Outpatient Medications Prior to Visit  Medication Sig Dispense Refill  . gabapentin (NEURONTIN) 600 MG tablet Take 1.5 tablets (900 mg total) by mouth at bedtime. 135 tablet 1  . lisinopril (ZESTRIL) 10 MG tablet TAKE 1 TABLET BY MOUTH EVERY DAY 90 tablet 3  . simvastatin (ZOCOR) 20 MG tablet TAKE 1 TABLET BY MOUTH EVERY DAY IN THE EVENING 90 tablet 0   No facility-administered medications prior to visit.  No Known Allergies  ROS Review of Systems  Constitutional: Negative for activity change, appetite change, fatigue, fever and unexpected weight change.  HENT: Negative for congestion, ear pain and trouble swallowing.   Eyes: Negative for pain and visual disturbance.  Respiratory: Negative for cough, shortness of breath and wheezing.   Cardiovascular: Negative for chest pain and palpitations.  Gastrointestinal: Negative for abdominal distention, abdominal pain, blood in stool, constipation, diarrhea, nausea, rectal pain and vomiting.  Genitourinary: Negative for dysuria, hematuria and testicular pain.  Musculoskeletal: Positive for arthralgias. Negative for joint swelling.  Skin: Negative for rash.  Neurological: Negative for dizziness, syncope and headaches.  Hematological: Negative for  adenopathy.  Psychiatric/Behavioral: Negative for confusion and dysphoric mood.      Objective:    Physical Exam Constitutional:      General: He is not in acute distress.    Appearance: He is well-developed and well-nourished.  HENT:     Head: Normocephalic and atraumatic.     Right Ear: External ear normal.     Left Ear: External ear normal.     Mouth/Throat:     Mouth: Oropharynx is clear and moist.  Eyes:     Extraocular Movements: EOM normal.     Conjunctiva/sclera: Conjunctivae normal.     Pupils: Pupils are equal, round, and reactive to light.  Neck:     Thyroid: No thyromegaly.  Cardiovascular:     Rate and Rhythm: Normal rate and regular rhythm.     Heart sounds: Normal heart sounds. No murmur heard.   Pulmonary:     Effort: No respiratory distress.     Breath sounds: No wheezing or rales.  Abdominal:     General: Bowel sounds are normal. There is no distension.     Palpations: Abdomen is soft. There is no mass.     Tenderness: There is no abdominal tenderness. There is no guarding or rebound.  Musculoskeletal:        General: No edema.     Cervical back: Normal range of motion and neck supple.     Right lower leg: No edema.     Left lower leg: No edema.  Lymphadenopathy:     Cervical: No cervical adenopathy.  Skin:    Findings: No rash.  Neurological:     Mental Status: He is alert and oriented to person, place, and time.     Cranial Nerves: No cranial nerve deficit.     Deep Tendon Reflexes: Reflexes normal.  Psychiatric:        Mood and Affect: Mood and affect normal.     BP 118/70   Pulse 68   Ht 5\' 11"  (1.803 m)   Wt 191 lb (86.6 kg)   SpO2 97%   BMI 26.64 kg/m  Wt Readings from Last 3 Encounters:  04/24/20 191 lb (86.6 kg)  01/07/19 196 lb 5 oz (89 kg)  12/28/18 198 lb 3.2 oz (89.9 kg)     There are no preventive care reminders to display for this patient.  There are no preventive care reminders to display for this patient.  Lab  Results  Component Value Date   TSH 1.34 12/28/2018   Lab Results  Component Value Date   WBC 6.3 12/28/2018   HGB 15.2 12/28/2018   HCT 45.1 12/28/2018   MCV 93.9 12/28/2018   PLT 221.0 12/28/2018   Lab Results  Component Value Date   NA 136 12/28/2018   K 4.5 12/28/2018   CO2 28 12/28/2018  GLUCOSE 83 12/28/2018   BUN 11 12/28/2018   CREATININE 1.06 12/28/2018   BILITOT 0.9 12/28/2018   ALKPHOS 65 12/28/2018   AST 16 12/28/2018   ALT 14 12/28/2018   PROT 7.0 12/28/2018   ALBUMIN 4.5 12/28/2018   CALCIUM 10.2 12/28/2018   GFR 68.72 12/28/2018   Lab Results  Component Value Date   CHOL 161 12/28/2018   Lab Results  Component Value Date   HDL 73.10 12/28/2018   Lab Results  Component Value Date   LDLCALC 68 12/28/2018   Lab Results  Component Value Date   TRIG 102.0 12/28/2018   Lab Results  Component Value Date   CHOLHDL 2 12/28/2018   No results found for: HGBA1C    Assessment & Plan:   Problem List Items Addressed This Visit   None   Visit Diagnoses    Physical exam    -  Primary   Relevant Orders   Basic metabolic panel   Lipid panel   CBC with Differential/Platelet   TSH   Hepatic function panel   PSA, Medicare    -Needs repeat colonoscopy by this fall. -We discussed setting up a one-time ultrasound aortic aneurysm screen given his past history of smoking -Discussed Shingrix vaccine and consider getting at pharmacy at some point this year -Obtain follow-up labs  No orders of the defined types were placed in this encounter.   Follow-up: No follow-ups on file.    Carolann Littler, MD

## 2020-04-24 NOTE — Patient Instructions (Signed)
Preventive Care 65 Years and Older, Male Preventive care refers to lifestyle choices and visits with your health care provider that can promote health and wellness. This includes:  A yearly physical exam. This is also called an annual wellness visit.  Regular dental and eye exams.  Immunizations.  Screening for certain conditions.  Healthy lifestyle choices, such as: ? Eating a healthy diet. ? Getting regular exercise. ? Not using drugs or products that contain nicotine and tobacco. ? Limiting alcohol use. What can I expect for my preventive care visit? Physical exam Your health care provider will check your:  Height and weight. These may be used to calculate your BMI (body mass index). BMI is a measurement that tells if you are at a healthy weight.  Heart rate and blood pressure.  Body temperature.  Skin for abnormal spots. Counseling Your health care provider may ask you questions about your:  Past medical problems.  Family's medical history.  Alcohol, tobacco, and drug use.  Emotional well-being.  Home life and relationship well-being.  Sexual activity.  Diet, exercise, and sleep habits.  History of falls.  Memory and ability to understand (cognition).  Work and work environment.  Access to firearms. What immunizations do I need? Vaccines are usually given at various ages, according to a schedule. Your health care provider will recommend vaccines for you based on your age, medical history, and lifestyle or other factors, such as travel or where you work.   What tests do I need? Blood tests  Lipid and cholesterol levels. These may be checked every 5 years, or more often depending on your overall health.  Hepatitis C test.  Hepatitis B test. Screening  Lung cancer screening. You may have this screening every year starting at age 55 if you have a 30-pack-year history of smoking and currently smoke or have quit within the past 15 years.  Colorectal  cancer screening. ? All adults should have this screening starting at age 50 and continuing until age 75. ? Your health care provider may recommend screening at age 45 if you are at increased risk. ? You will have tests every 1-10 years, depending on your results and the type of screening test.  Prostate cancer screening. Recommendations will vary depending on your family history and other risks.  Genital exam to check for testicular cancer or hernias.  Diabetes screening. ? This is done by checking your blood sugar (glucose) after you have not eaten for a while (fasting). ? You may have this done every 1-3 years.  Abdominal aortic aneurysm (AAA) screening. You may need this if you are a current or former smoker.  STD (sexually transmitted disease) testing, if you are at risk. Follow these instructions at home: Eating and drinking  Eat a diet that includes fresh fruits and vegetables, whole grains, lean protein, and low-fat dairy products. Limit your intake of foods with high amounts of sugar, saturated fats, and salt.  Take vitamin and mineral supplements as recommended by your health care provider.  Do not drink alcohol if your health care provider tells you not to drink.  If you drink alcohol: ? Limit how much you have to 0-2 drinks a day. ? Be aware of how much alcohol is in your drink. In the [place] one drink equals one 12 oz bottle of beer (355 mL), one 5 oz glass of wine (148 mL), or one 1 oz glass of hard liquor (44 mL).   Lifestyle  Take daily care of your teeth   and gums. Brush your teeth every morning and night with fluoride toothpaste. Floss one time each day.  Stay active. Exercise for at least 30 minutes 5 or more days each week.  Do not use any products that contain nicotine or tobacco, such as cigarettes, e-cigarettes, and chewing tobacco. If you need help quitting, ask your health care provider.  Do not use drugs.  If you are sexually active, practice safe sex.  Use a condom or other form of protection to prevent STIs (sexually transmitted infections).  Talk with your health care provider about taking a low-dose aspirin or statin.  Find healthy ways to cope with stress, such as: ? Meditation, yoga, or listening to music. ? Journaling. ? Talking to a trusted person. ? Spending time with friends and family. Safety  Always wear your seat belt while driving or riding in a vehicle.  Do not drive: ? If you have been drinking alcohol. Do not ride with someone who has been drinking. ? When you are tired or distracted. ? While texting.  Wear a helmet and other protective equipment during sports activities.  If you have firearms in your house, make sure you follow all gun safety procedures. What's next?  Visit your health care provider once a year for an annual wellness visit.  Ask your health care provider how often you should have your eyes and teeth checked.  Stay up to date on all vaccines. This information is not intended to replace advice given to you by your health care provider. Make sure you discuss any questions you have with your health care provider. Document Revised: 12/14/2018 Document Reviewed: 03/11/2018 Elsevier Patient Education  2021 [employer]vier Inc.  

## 2020-04-25 ENCOUNTER — Encounter: Payer: Self-pay | Admitting: Family Medicine

## 2020-04-25 NOTE — Progress Notes (Signed)
Mychart message sent: Labs all stable.  Lipids are good.  PSA remains very low.  No concerns.

## 2020-04-26 ENCOUNTER — Ambulatory Visit
Admission: RE | Admit: 2020-04-26 | Discharge: 2020-04-26 | Disposition: A | Discharge status: Home / Self Care | Payer: Medicare PPO | Source: Ambulatory Visit | Attending: Family Medicine | Admitting: Family Medicine

## 2020-04-26 DIAGNOSIS — Z136 Encounter for screening for cardiovascular disorders: Secondary | ICD-10-CM

## 2020-04-26 DIAGNOSIS — Z87891 Personal history of nicotine dependence: Secondary | ICD-10-CM | POA: Diagnosis not present

## 2020-04-27 NOTE — Progress Notes (Signed)
Mychart message sent: No abdominal aneurysm.  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   No further intervention at this time.  Can discuss possible follow up imaging at [facility] next year.

## 2020-05-02 DIAGNOSIS — M25551 Pain in right hip: Secondary | ICD-10-CM | POA: Diagnosis not present

## 2020-05-28 ENCOUNTER — Other Ambulatory Visit: Payer: Self-pay | Admitting: Family Medicine

## 2020-06-21 DIAGNOSIS — L738 Other specified follicular disorders: Secondary | ICD-10-CM | POA: Diagnosis not present

## 2020-06-21 DIAGNOSIS — D2262 Melanocytic nevi of left upper limb, including shoulder: Secondary | ICD-10-CM | POA: Diagnosis not present

## 2020-06-21 DIAGNOSIS — D692 Other nonthrombocytopenic purpura: Secondary | ICD-10-CM | POA: Diagnosis not present

## 2020-06-21 DIAGNOSIS — D1801 Hemangioma of skin and subcutaneous tissue: Secondary | ICD-10-CM | POA: Diagnosis not present

## 2020-06-21 DIAGNOSIS — L821 Other seborrheic keratosis: Secondary | ICD-10-CM | POA: Diagnosis not present

## 2020-06-21 DIAGNOSIS — Z85828 Personal history of other malignant neoplasm of skin: Secondary | ICD-10-CM | POA: Diagnosis not present

## 2020-06-21 DIAGNOSIS — D2261 Melanocytic nevi of right upper limb, including shoulder: Secondary | ICD-10-CM | POA: Diagnosis not present

## 2020-06-21 DIAGNOSIS — D224 Melanocytic nevi of scalp and neck: Secondary | ICD-10-CM | POA: Diagnosis not present

## 2020-06-21 DIAGNOSIS — D225 Melanocytic nevi of trunk: Secondary | ICD-10-CM | POA: Diagnosis not present

## 2020-07-11 ENCOUNTER — Other Ambulatory Visit: Payer: Self-pay

## 2020-07-11 DIAGNOSIS — Z0000 Encounter for general adult medical examination without abnormal findings: Secondary | ICD-10-CM

## 2020-07-13 ENCOUNTER — Other Ambulatory Visit: Payer: Self-pay | Admitting: Family Medicine

## 2020-07-26 ENCOUNTER — Encounter: Payer: Self-pay | Admitting: Family Medicine

## 2020-07-26 DIAGNOSIS — M25551 Pain in right hip: Secondary | ICD-10-CM | POA: Diagnosis not present

## 2020-07-26 DIAGNOSIS — M25552 Pain in left hip: Secondary | ICD-10-CM | POA: Diagnosis not present

## 2020-08-01 ENCOUNTER — Other Ambulatory Visit: Payer: Self-pay

## 2020-08-01 ENCOUNTER — Ambulatory Visit (INDEPENDENT_AMBULATORY_CARE_PROVIDER_SITE_OTHER): Payer: Medicare PPO

## 2020-08-01 DIAGNOSIS — Z0000 Encounter for general adult medical examination without abnormal findings: Secondary | ICD-10-CM | POA: Diagnosis not present

## 2020-08-01 DIAGNOSIS — M25551 Pain in right hip: Secondary | ICD-10-CM | POA: Diagnosis not present

## 2020-08-01 NOTE — Patient Instructions (Signed)
[name] , Thank you for taking time to come for your Medicare Wellness Visit. I appreciate your ongoing commitment to your health goals. Please review the following plan we discussed and let me know if I can assist you in the future.   Screening recommendations/referrals: Colonoscopy: Done 02/06/11 Recommended yearly ophthalmology/optometry visit for glaucoma screening and checkup Recommended yearly dental visit for hygiene and checkup  Vaccinations: Influenza vaccine: Up to date Pneumococcal vaccine: Up to date Tdap vaccine: Up to date Shingles vaccine: 1st 05/22/20   Covid-19: Completed 1/20, 2/10 & 12/26/19  Advanced directives: Please bring a copy of your health care power of attorney and living will to the office at your convenience.  Conditions/risks identified: Get back to exercise  Next appointment: Follow up in one year for your annual wellness visit.   Preventive Care 19 Years and Older, Male Preventive care refers to lifestyle choices and visits with your health care provider that can promote health and wellness. What does preventive care include?  A yearly physical exam. This is also called an annual well check.  Dental exams once or twice a year.  Routine eye exams. Ask your health care provider how often you should have your eyes checked.  Personal lifestyle choices, including:  Daily care of your teeth and gums.  Regular physical activity.  Eating a healthy diet.  Avoiding tobacco and drug use.  Limiting alcohol use.  Practicing safe sex.  Taking low doses of aspirin every day.  Taking vitamin and mineral supplements as recommended by your health care provider. What happens during an annual well check? The services and screenings done by your health care provider during your annual well check will depend on your age, overall health, lifestyle risk factors, and family history of disease. Counseling  Your health care provider may ask you questions  about your:  Alcohol use.  Tobacco use.  Drug use.  Emotional well-being.  Home and relationship well-being.  Sexual activity.  Eating habits.  History of falls.  Memory and ability to understand (cognition).  Work and work Statistician. Screening  You may have the following tests or measurements:  Height, weight, and BMI.  Blood pressure.  Lipid and cholesterol levels. These may be checked every 5 years, or more frequently if you are over 70 years old.  Skin check.  Lung cancer screening. You may have this screening every year starting at age 65 if you have a 30-pack-year history of smoking and currently smoke or have quit within the past 15 years.  Fecal occult blood test (FOBT) of the stool. You may have this test every year starting at age 14.  Flexible sigmoidoscopy or colonoscopy. You may have a sigmoidoscopy every 5 years or a colonoscopy every 10 years starting at age 62.  Prostate cancer screening. Recommendations will vary depending on your family history and other risks.  Hepatitis C blood test.  Hepatitis B blood test.  Sexually transmitted disease (STD) testing.  Diabetes screening. This is done by checking your blood sugar (glucose) after you have not eaten for a while (fasting). You may have this done every 1-3 years.  Abdominal aortic aneurysm (AAA) screening. You may need this if you are a current or former smoker.  Osteoporosis. You may be screened starting at age 69 if you are at high risk. Talk with your health care provider about your test results, treatment options, and if necessary, the need for more tests. Vaccines  Your health care provider may recommend certain vaccines,  such as:  Influenza vaccine. This is recommended every year.  Tetanus, diphtheria, and acellular pertussis (Tdap, Td) vaccine. You may need a Td booster every 10 years.  Zoster vaccine. You may need this after age 38.  Pneumococcal 13-valent conjugate (PCV13)  vaccine. One dose is recommended after age 29.  Pneumococcal polysaccharide (PPSV23) vaccine. One dose is recommended after age 37. Talk to your health care provider about which screenings and vaccines you need and how often you need them. This information is not intended to replace advice given to you by your health care provider. Make sure you discuss any questions you have with your health care provider. Document Released: 04/13/2015 Document Revised: 12/05/2015 Document Reviewed: 01/16/2015 Elsevier Interactive Patient Education  2017 [place] Prevention in the Home Falls can cause injuries. They can happen to people of all ages. There are many things you can do to make your home safe and to help prevent falls. What can I do on the outside of my home?  Regularly fix the edges of walkways and driveways and fix any cracks.  Remove anything that might make you trip as you walk through a door, such as a raised step or threshold.  Trim any bushes or trees on the path to your home.  Use bright outdoor lighting.  Clear any walking paths of anything that might make someone trip, such as rocks or tools.  Regularly check to see if handrails are loose or broken. Make sure that both sides of any steps have handrails.  Any raised decks and porches should have guardrails on the edges.  Have any leaves, snow, or ice cleared regularly.  Use sand or salt on walking paths during winter.  Clean up any spills in your garage right away. This includes oil or grease spills. What can I do in the bathroom?  Use night lights.  Install grab bars by the toilet and in the tub and shower. Do not use towel bars as grab bars.  Use non-skid mats or decals in the tub or shower.  If you need to sit down in the shower, use a plastic, non-slip stool.  Keep the floor dry. Clean up any water that spills on the floor as soon as it happens.  Remove soap buildup in the tub or shower  regularly.  Attach bath mats securely with double-sided non-slip rug tape.  Do not have throw rugs and other things on the floor that can make you trip. What can I do in the bedroom?  Use night lights.  Make sure that you have a light by your bed that is easy to reach.  Do not use any sheets or blankets that are too big for your bed. They should not hang down onto the floor.  Have a firm chair that has side arms. You can use this for support while you get dressed.  Do not have throw rugs and other things on the floor that can make you trip. What can I do in the kitchen?  Clean up any spills right away.  Avoid walking on wet floors.  Keep items that you use a lot in easy-to-reach places.  If you need to reach something above you, use a strong step stool that has a grab bar.  Keep electrical cords out of the way.  Do not use floor polish or wax that makes floors slippery. If you must use wax, use non-skid floor wax.  Do not have throw rugs and other things on  the floor that can make you trip. What can I do with my stairs?  Do not leave any items on the stairs.  Make sure that there are handrails on both sides of the stairs and use them. Fix handrails that are broken or loose. Make sure that handrails are as long as the stairways.  Check any carpeting to make sure that it is firmly attached to the stairs. Fix any carpet that is loose or worn.  Avoid having throw rugs at the top or bottom of the stairs. If you do have throw rugs, attach them to the floor with carpet tape.  Make sure that you have a light switch at the top of the stairs and the bottom of the stairs. If you do not have them, ask someone to add them for you. What else can I do to help prevent falls?  Wear shoes that:  Do not have high heels.  Have rubber bottoms.  Are comfortable and fit you well.  Are closed at the toe. Do not wear sandals.  If you use a stepladder:  Make sure that it is fully  opened. Do not climb a closed stepladder.  Make sure that both sides of the stepladder are locked into place.  Ask someone to hold it for you, if possible.  Clearly mark and make sure that you can see:  Any grab bars or handrails.  First and last steps.  Where the edge of each step is.  Use tools that help you move around (mobility aids) if they are needed. These include:  Canes.  Walkers.  Scooters.  Crutches.  Turn on the lights when you go into a dark area. Replace any light bulbs as soon as they burn out.  Set up your furniture so you have a clear path. Avoid moving your furniture around.  If any of your floors are uneven, fix them.  If there are any pets around you, be aware of where they are.  Review your medicines with your doctor. Some medicines can make you feel dizzy. This can increase your chance of falling. Ask your doctor what other things that you can do to help prevent falls. This information is not intended to replace advice given to you by your health care provider. Make sure you discuss any questions you have with your health care provider. Document Released: 01/11/2009 Document Revised: 08/23/2015 Document Reviewed: 04/21/2014 Elsevier Interactive Patient Education  2017 [employer].

## 2020-08-01 NOTE — Progress Notes (Signed)
Virtual Visit via Telephone Note  I connected with  Adam Choi. on 08/01/20 at 11:00 AM EDT by telephone and verified that I am speaking with the correct person using two identifiers.  Location: Patient: Home Provider: [place] Persons participating in the virtual visit: patient/Nurse Health Advisor   I discussed the limitations, risks, security and privacy concerns of performing an evaluation and management service by telephone and the availability of in person appointments. The patient expressed understanding and agreed to proceed.  Interactive audio and video telecommunications were attempted between this nurse and patient, however failed, due to patient having technical difficulties OR patient did not have access to video capability.  We continued and completed visit with audio only.  Some vital signs may be absent or patient reported.   Marzella Schlein, LPN         Subjective:   Adam Choi. is a 75 y.o. male who presents for an Initial Medicare Annual Wellness Visit.  Review of Systems    Cardiac Risk Factors include: advanced age (>63men, >21 women);hypertension;dyslipidemia;male gender     Objective:    There were no vitals filed for this visit. There is no height or weight on file to calculate BMI.  Advanced Directives 08/01/2020  Does Patient Have a Medical Advance Directive? Yes  Type of Estate agent of [place];Living will  Copy of Healthcare Power of Attorney in Chart? No - copy requested    Current Medications (verified) Outpatient Encounter Medications as of 08/01/2020  Medication Sig  . gabapentin (NEURONTIN) 600 MG tablet TAKE 1.5 TABLETS (900 MG TOTAL) BY MOUTH AT BEDTIME.  Marland Kitchen lisinopril (ZESTRIL) 10 MG tablet TAKE 1 TABLET BY MOUTH EVERY DAY  . simvastatin (ZOCOR) 20 MG tablet TAKE 1 TABLET BY MOUTH EVERY DAY IN THE EVENING   No facility-administered encounter medications on file as of 08/01/2020.    Allergies  (verified) Patient has no known allergies.   History: Past Medical History:  Diagnosis Date  . Arthritis   . Chicken pox   . Colon polyp 2012  . Fainting spell   . GERD (gastroesophageal reflux disease)    Past Surgical History:  Procedure Laterality Date  . FOOT SURGERY  2013   halux rigidus/bones spur   Family History  Problem Relation Age of Onset  . Cancer Mother        breast  . Stroke Mother   . Diabetes Mother        type ll  . Emphysema Father   . Cancer Sister 65       lung cancer   Social History   Socioeconomic History  . Marital status: Married    Spouse name: Not on file  . Number of children: Not on file  . Years of education: Not on file  . Highest education level: Not on file  Occupational History  . Not on file  Tobacco Use  . Smoking status: Former Smoker    Packs/day: 1.00    Years: 30.00    Pack years: 30.00    Types: Cigarettes    Quit date: 04/23/2008    Years since quitting: 12.2  . Smokeless tobacco: Never Used  Vaping Use  . Vaping Use: Never used  Substance and Sexual Activity  . Alcohol use: Not on file  . Drug use: Not on file  . Sexual activity: Not on file  Other Topics Concern  . Not on file  Social History Narrative  . Not on  file   Social Determinants of Health   Financial Resource Strain: Low Risk   . Difficulty of Paying Living Expenses: Not hard at all  Food Insecurity: No Food Insecurity  . Worried About Charity fundraiser in the Last Year: Never true  . Ran Out of Food in the Last Year: Never true  Transportation Needs: No Transportation Needs  . Lack of Transportation (Medical): No  . Lack of Transportation (Non-Medical): No  Physical Activity: Inactive  . Days of Exercise per Week: 0 days  . Minutes of Exercise per Session: 0 min  Stress: No Stress Concern Present  . Feeling of Stress : Not at all  Social Connections: Moderately Integrated  . Frequency of Communication with Friends and Family: Three  times a week  . Frequency of Social Gatherings with Friends and Family: Once a week  . Attends Religious Services: More than 4 times per year  . Active Member of Clubs or Organizations: No  . Attends Archivist Meetings: Never  . Marital Status: Married    Tobacco Counseling Counseling given: Not Answered   Clinical Intake:  Pre-visit preparation completed: Yes  Pain : 0-10 Pain Type: Chronic pain Pain Location: Hip Pain Descriptors / Indicators: Sharp,Shooting Pain Onset: More than a month ago Pain Frequency: Intermittent     BMI - recorded: 26.65 Nutritional Status: BMI 25 -29 Overweight Diabetes: No  How often do you need to have someone help you when you read instructions, pamphlets, or other written materials from your doctor or pharmacy?: 1 - Never  Diabetic?No  Interpreter Needed?: No  Information entered by :: Charlott Rakes, LPN   Activities of Daily Living In your present state of health, do you have any difficulty performing the following activities: 08/01/2020 04/24/2020  Hearing? N N  Vision? N N  Difficulty concentrating or making decisions? N N  Walking or climbing stairs? N N  Dressing or bathing? N N  Doing errands, shopping? N N  Preparing Food and eating ? N -  Using the Toilet? N -  In the past six months, have you accidently leaked urine? N -  Do you have problems with loss of bowel control? N -  Managing your Medications? N -  Managing your Finances? N -  Housekeeping or managing your Housekeeping? N -  Some recent data might be hidden    Patient Care Team: Eulas Post, MD as PCP - General (Family Medicine)  Indicate any recent Medical Services you may have received from other than [facility] providers in the past year (date may be approximate).     Assessment:   This is a routine wellness examination for [facility].  Hearing/Vision screen  Hearing Screening   125Hz  250Hz  500Hz  1000Hz  2000Hz  3000Hz  4000Hz  6000Hz  8000Hz    Right ear:           Left ear:           Comments: Pt denies any hearing loss   Vision Screening Comments: Pt follows up with Dr Baldemar Lenis for annual eye exams   Dietary issues and exercise activities discussed: Current Exercise Habits: The patient does not participate in regular exercise at present  Goals Addressed            This Visit's Progress   . Patient Stated       Get back to exercise      Depression Screen PHQ 2/9 Scores 08/01/2020 04/24/2020 12/28/2018 12/25/2017 02/12/2014 02/10/2014  PHQ - 2 Score 0  0 0 0 0 0  PHQ- 9 Score - 2 - - - -    Fall Risk Fall Risk  08/01/2020 04/24/2020 12/25/2017 09/08/2016 08/11/2016  Falls in the past year? 0 0 No Yes Yes  Number falls in past yr: 0 - - 1 1  Injury with Fall? 0 - - - Yes  Risk for fall due to : Impaired vision - - - -  Follow up Falls prevention discussed - - - -    FALL RISK PREVENTION PERTAINING TO THE HOME:  Any stairs in or around the home? Yes  If so, are there any without handrails? No  Home free of loose throw rugs in walkways, pet beds, electrical cords, etc? Yes  Adequate lighting in your home to reduce risk of falls? Yes   ASSISTIVE DEVICES UTILIZED TO PREVENT FALLS:  Life alert? No  Use of a cane, walker or w/c? No  Grab bars in the bathroom? Yes  Shower chair or bench in shower? Yes  Elevated toilet seat or a handicapped toilet? Yes   TIMED UP AND GO:  Was the test performed? No    Cognitive Function:     6CIT Screen 08/01/2020  What Year? 0 points  What month? 0 points  Count back from 20 0 points  Months in reverse 0 points  Repeat phrase 0 points    Immunizations Immunization History  Administered Date(s) Administered  . Fluad Quad(high Dose 65+) 12/28/2018  . Influenza Split 01/14/2012  . Influenza, High Dose Seasonal PF 02/02/2015, 12/04/2015, 12/25/2017  . Influenza,inj,Quad PF,6+ Mos 12/13/2012, 01/04/2014  . Influenza-Unspecified 11/20/2016, 01/13/2017, 12/26/2019  .  PFIZER(Purple Top)SARS-COV-2 Vaccination 04/20/2019, 05/11/2019, 12/26/2019  . Pneumococcal Conjugate-13 02/10/2014  . Pneumococcal Polysaccharide-23 12/13/2012  . Td 01/07/2019  . Zoster Recombinat (Shingrix) 05/22/2020    TDAP status: Up to date  Flu Vaccine status: Up to date  Pneumococcal vaccine status: Up to date  Covid-19 vaccine status: Completed vaccines  Qualifies for Choi Vaccine? Yes   Zostavax completed Yes   Shingrix Completed?: Yes  Screening Tests Health Maintenance  Topic Date Due  . INFLUENZA VACCINE  10/29/2020  . COLONOSCOPY (Pts 45-58yrs Insurance coverage will need to be confirmed)  02/05/2021  . TETANUS/TDAP  01/06/2029  . COVID-19 Vaccine  Completed  . Hepatitis C Screening  Completed  . PNA vac Low Risk Adult  Completed  . HPV VACCINES  Aged Out    Health Maintenance  There are no preventive care reminders to display for this patient.  Colorectal cancer screening: Type of screening: Colonoscopy. Completed 02/06/11. Repeat every 10 years   Additional Screening:  Hepatitis C Screening:  Completed 12/26/19  Vision Screening: Recommended annual ophthalmology exams for early detection of glaucoma and other disorders of the eye. Is the patient up to date with their annual eye exam?  Yes  Who is the provider or what is the name of the office in which the patient attends annual eye exams? Dr Baldemar Lenis  If pt is not established with a provider, would they like to be referred to a provider to establish care? No .   Dental Screening: Recommended annual dental exams for proper oral hygiene  Community Resource Referral / Chronic Care Management: CRR required this visit?  No   CCM required this visit?  No      Plan:     I have personally reviewed and noted the following in the patient's chart:   . Medical and social history . Use  of alcohol, tobacco or illicit drugs  . Current medications and supplements including opioid  prescriptions. Patient is not currently taking opioid prescriptions. . Functional ability and status . Nutritional status . Physical activity . Advanced directives . List of other physicians . Hospitalizations, surgeries, and ER visits in previous 12 months . Vitals . Screenings to include cognitive, depression, and falls . Referrals and appointments  In addition, I have reviewed and discussed with patient certain preventive protocols, quality metrics, and best practice recommendations. A written personalized care plan for preventive services as well as general preventive health recommendations were provided to patient.     Willette Brace, LPN   05/09/5282   Nurse Notes: None

## 2020-08-31 DIAGNOSIS — M25551 Pain in right hip: Secondary | ICD-10-CM | POA: Diagnosis not present

## 2020-09-26 ENCOUNTER — Other Ambulatory Visit: Payer: Self-pay | Admitting: Family Medicine

## 2020-10-02 DIAGNOSIS — Z5181 Encounter for therapeutic drug level monitoring: Secondary | ICD-10-CM | POA: Diagnosis not present

## 2020-10-02 DIAGNOSIS — M1611 Unilateral primary osteoarthritis, right hip: Secondary | ICD-10-CM | POA: Diagnosis not present

## 2020-10-11 ENCOUNTER — Telehealth (INDEPENDENT_AMBULATORY_CARE_PROVIDER_SITE_OTHER): Payer: Medicare PPO | Admitting: Family Medicine

## 2020-10-11 ENCOUNTER — Encounter: Payer: Self-pay | Admitting: Family Medicine

## 2020-10-11 VITALS — Temp 97.8°F

## 2020-10-11 DIAGNOSIS — R0981 Nasal congestion: Secondary | ICD-10-CM

## 2020-10-11 DIAGNOSIS — Z20822 Contact with and (suspected) exposure to covid-19: Secondary | ICD-10-CM | POA: Diagnosis not present

## 2020-10-11 NOTE — Patient Instructions (Signed)
  HOME CARE TIPS:  -could do a PCR test for covid  -try zyrtec once daily for 1 week   -can use tylenol f needed for fevers, aches and pains per instructions  -can use nasal saline a few times per day if you have nasal congestion  -stay hydrated, drink plenty of fluids and eat small healthy meals - avoid dairy  -If the Covid test is positive, please follow up with your primary care doctor if any concerns and check out the [facility] website for more information on home care, transmission and treatment for COVID19  -follow up with your doctor in 2-3 days unless improving and feeling better  -stay home while sick, except to seek medical care. If you have COVID19, ideally it would be best to stay home for a full 10 days since the onset of symptoms PLUS one day of no fever and feeling better. Wear a good mask that fits snugly (such as N95 or KN95) if around others to reduce the risk of transmission.  It was nice to meet you today, and I really hope you are feeling better soon. I help [place] out with telemedicine visits on Tuesdays and Thursdays and am available for visits on those days. If you have any concerns or questions following this visit please schedule a follow up visit with your Primary Care doctor or seek care at a local urgent care clinic to avoid delays in care.    Seek in person care or schedule a follow up video visit promptly if your symptoms worsen, new concerns arise or you are not improving with treatment. Call 911 and/or seek emergency care if your symptoms are severe or life threatening.

## 2020-10-11 NOTE — Progress Notes (Signed)
Virtual Visit via Video Note  I connected with Tom  on 10/11/20 at 12:40 PM EDT by a video enabled telemedicine application and verified that I am speaking with the correct person using two identifiers.  Location patient: home, [place] Location provider:work or home office Persons participating in the virtual visit: patient, provider  I discussed the limitations of evaluation and management by telemedicine and the availability of in person appointments. The patient expressed understanding and agreed to proceed.   HPI:  Acute telemedicine visit for : -Onset: 4 days ago -he had two negative covid tests, has ortho surgery coming up and they advised a PCR test so he will be doing that as well -Symptoms include: sore throat, cough, nasal congestion, watery eyes, sneezing -Denies: known sick contacts, fever, CP, SOB, NVD, body aches, inability to eat/drink/get out of bed -Has tried:aleve, salt water gargle -Pertinent past medical history: sometimes has trouble with allergies this past year, none before -Pertinent medication allergies:No Known Allergies -COVID-19 vaccine status: two doses + 2 boosters  ROS: See pertinent positives and negatives per HPI.  Past Medical History:  Diagnosis Date   Arthritis    Chicken pox    Colon polyp 2012   Fainting spell    GERD (gastroesophageal reflux disease)     Past Surgical History:  Procedure Laterality Date   FOOT SURGERY  2013   halux rigidus/bones spur     Current Outpatient Medications:    gabapentin (NEURONTIN) 600 MG tablet, TAKE 1 AND 1/2 TABLETS BY MOUTH AT BEDTIME (Patient taking differently: Take 600 mg by mouth at bedtime.), Disp: 135 tablet, Rfl: 0   lisinopril (ZESTRIL) 10 MG tablet, TAKE 1 TABLET BY MOUTH EVERY DAY, Disp: 90 tablet, Rfl: 3   simvastatin (ZOCOR) 20 MG tablet, TAKE 1 TABLET BY MOUTH EVERY DAY IN THE EVENING, Disp: 90 tablet, Rfl: 0  EXAM:  VITALS per patient if applicable:  GENERAL: alert, oriented, appears  well and in no acute distress  HEENT: atraumatic, conjunttiva clear, no obvious abnormalities on inspection of external nose and ears  NECK: normal movements of the head and neck  LUNGS: on inspection no signs of respiratory distress, breathing rate appears normal, no obvious gross SOB, gasping or wheezing  CV: no obvious cyanosis  MS: moves all visible extremities without noticeable abnormality  PSYCH/NEURO: pleasant and cooperative, no obvious depression or anxiety, speech and thought processing grossly intact  ASSESSMENT AND PLAN:  Discussed the following assessment and plan:  Nasal congestion  -we discussed possible serious and likely etiologies, options for evaluation and workup, limitations of telemedicine visit vs in person visit, treatment, treatment risks and precautions. Pt prefers to treat via telemedicine empirically rather than in person at this moment. Query VURI, allergic rhinitis vs other. Possible covid with false neg home testing and he plans to do PCR per ortho recs. Opted for nasal saline, zyrtec for a week, salt water gargles if needed, tylenol if needed.  Advised to seek prompt in person care if worsening, new symptoms arise, or if is not improving with treatment. Discussed options for inperson care if PCP office not available. Did let this patient know that I only do telemedicine on Tuesdays and Thursdays for [facility]. Advised to schedule follow up visit with PCP or [facility] if any further questions or concerns to avoid delays in care.   I discussed the assessment and treatment plan with the patient. The patient was provided an opportunity to ask questions and all were answered. The patient agreed  with the plan and demonstrated an understanding of the instructions.     Lucretia Kern, DO

## 2020-10-23 DIAGNOSIS — M1611 Unilateral primary osteoarthritis, right hip: Secondary | ICD-10-CM | POA: Diagnosis not present

## 2020-11-27 DIAGNOSIS — Z471 Aftercare following joint replacement surgery: Secondary | ICD-10-CM | POA: Diagnosis not present

## 2020-11-27 DIAGNOSIS — Z96641 Presence of right artificial hip joint: Secondary | ICD-10-CM | POA: Diagnosis not present

## 2020-12-17 ENCOUNTER — Telehealth: Payer: Self-pay | Admitting: Family Medicine

## 2020-12-17 NOTE — Telephone Encounter (Signed)
PT spouse called to advise that their husband tested positive for Covid and would like to see what is the recommended steps to take. Please advise as well as to how long they need to quarantine.

## 2020-12-17 NOTE — Telephone Encounter (Signed)
Spoke with the patient's wife. She stated both of them are positive. Appointment have been scheduled for both with their PCP.

## 2020-12-18 ENCOUNTER — Telehealth (INDEPENDENT_AMBULATORY_CARE_PROVIDER_SITE_OTHER): Payer: Medicare PPO | Admitting: Family Medicine

## 2020-12-18 ENCOUNTER — Encounter: Payer: Self-pay | Admitting: Family Medicine

## 2020-12-18 VITALS — Temp 98.4°F

## 2020-12-18 DIAGNOSIS — U071 COVID-19: Secondary | ICD-10-CM | POA: Diagnosis not present

## 2020-12-18 NOTE — Progress Notes (Signed)
Patient ID: Adam Choi., male   DOB: 1945/09/11, 75 y.o.   MRN: [id number]   This visit type was conducted due to national recommendations for restrictions regarding the COVID-19 pandemic in an effort to limit this patient's exposure and mitigate transmission in our community.   Virtual Visit via Video Note  I connected with Skeet Latch on 12/18/20 at  4:00 PM EDT by a video enabled telemedicine application and verified that I am speaking with the correct person using two identifiers.  Location patient: home Location provider:work or home office Persons participating in the virtual visit: patient, provider  I discussed the limitations of evaluation and management by telemedicine and the availability of in person appointments. The patient expressed understanding and agreed to proceed.   HPI:  Mr. Braulio Conte has COVID-19.  He states his wife started feeling poorly last Thursday.  Her initial COVID testing was negative but then Sunday she had a positive test.  He went ahead and tested positive Sunday as well.  At that point he had no symptoms but yesterday developed some rhinorrhea and mild cough.  He had fever 100.8 yesterday but none today.  Feels somewhat better overall.  Cough really minimal.  Pulse oximetry has been stable and vital signs stable.  1 episode of diarrhea earlier today.  No nausea or vomiting.  Some mild fatigue.  He has been fully vaccinated.  ROS: See pertinent positives and negatives per HPI.  Past Medical History:  Diagnosis Date   Arthritis    Chicken pox    Colon polyp 2012   Fainting spell    GERD (gastroesophageal reflux disease)     Past Surgical History:  Procedure Laterality Date   FOOT SURGERY  2013   halux rigidus/bones spur    Family History  Problem Relation Age of Onset   Cancer Mother        breast   Stroke Mother    Diabetes Mother        type ll   Emphysema Father    Cancer Sister 17       lung cancer    SOCIAL HX:  Non-smoker   Current Outpatient Medications:    gabapentin (NEURONTIN) 600 MG tablet, TAKE 1 AND 1/2 TABLETS BY MOUTH AT BEDTIME (Patient taking differently: Take 600 mg by mouth at bedtime.), Disp: 135 tablet, Rfl: 0   lisinopril (ZESTRIL) 10 MG tablet, TAKE 1 TABLET BY MOUTH EVERY DAY, Disp: 90 tablet, Rfl: 3   simvastatin (ZOCOR) 20 MG tablet, TAKE 1 TABLET BY MOUTH EVERY DAY IN THE EVENING, Disp: 90 tablet, Rfl: 0  EXAM:  VITALS per patient if applicable:  GENERAL: alert, oriented, appears well and in no acute distress  HEENT: atraumatic, conjunttiva clear, no obvious abnormalities on inspection of external nose and ears  NECK: normal movements of the head and neck  LUNGS: on inspection no signs of respiratory distress, breathing rate appears normal, no obvious gross SOB, gasping or wheezing  CV: no obvious cyanosis  MS: moves all visible extremities without noticeable abnormality  PSYCH/NEURO: pleasant and cooperative, no obvious depression or anxiety, speech and thought processing grossly intact  ASSESSMENT AND PLAN:  Discussed the following assessment and plan:  COVID-19 infection.  Patient has very mild symptoms at this point and already feels somewhat better today.  -He is aware of isolation recommendations -We discussed antiviral therapy and at this point we discussed holding since his symptoms are very mild -Continue to monitor pulse oximetry.  He will let  us know by tomorrow if he has any relapse or feels any worse -Recommend over-the-counter medications as needed for cough     I discussed the assessment and treatment plan with the patient. The patient was provided an opportunity to ask questions and all were answered. The patient agreed with the plan and demonstrated an understanding of the instructions.   The patient was advised to call back or seek an in-person evaluation if the symptoms worsen or if the condition fails to improve as anticipated.     Carolann Littler, MD

## 2021-01-01 ENCOUNTER — Encounter: Payer: Self-pay | Admitting: Family Medicine

## 2021-01-01 NOTE — Telephone Encounter (Signed)
Please advise. Colonoscopy is due next on 02/05/2021. Imaging was ordered by another office. Does he need an order for the colonoscopy to have this done?

## 2021-01-02 ENCOUNTER — Other Ambulatory Visit: Payer: Self-pay | Admitting: Family Medicine

## 2021-01-09 DIAGNOSIS — M5136 Other intervertebral disc degeneration, lumbar region: Secondary | ICD-10-CM | POA: Diagnosis not present

## 2021-01-09 DIAGNOSIS — M5416 Radiculopathy, lumbar region: Secondary | ICD-10-CM | POA: Diagnosis not present

## 2021-01-21 DIAGNOSIS — M545 Low back pain, unspecified: Secondary | ICD-10-CM | POA: Diagnosis not present

## 2021-01-24 ENCOUNTER — Ambulatory Visit (INDEPENDENT_AMBULATORY_CARE_PROVIDER_SITE_OTHER)
Admission: RE | Admit: 2021-01-24 | Discharge: 2021-01-24 | Disposition: A | Discharge status: Home / Self Care | Payer: Medicare PPO | Source: Ambulatory Visit | Attending: Acute Care | Admitting: Acute Care

## 2021-01-24 ENCOUNTER — Other Ambulatory Visit: Payer: Self-pay

## 2021-01-24 DIAGNOSIS — Z87891 Personal history of nicotine dependence: Secondary | ICD-10-CM | POA: Diagnosis not present

## 2021-01-28 DIAGNOSIS — M545 Low back pain, unspecified: Secondary | ICD-10-CM | POA: Diagnosis not present

## 2021-01-31 DIAGNOSIS — M545 Low back pain, unspecified: Secondary | ICD-10-CM | POA: Diagnosis not present

## 2021-02-06 ENCOUNTER — Other Ambulatory Visit: Payer: Self-pay | Admitting: Acute Care

## 2021-02-06 DIAGNOSIS — Z87891 Personal history of nicotine dependence: Secondary | ICD-10-CM

## 2021-02-06 DIAGNOSIS — M545 Low back pain, unspecified: Secondary | ICD-10-CM | POA: Diagnosis not present

## 2021-02-13 DIAGNOSIS — M545 Low back pain, unspecified: Secondary | ICD-10-CM | POA: Diagnosis not present

## 2021-02-19 DIAGNOSIS — M545 Low back pain, unspecified: Secondary | ICD-10-CM | POA: Diagnosis not present

## 2021-02-27 DIAGNOSIS — M545 Low back pain, unspecified: Secondary | ICD-10-CM | POA: Diagnosis not present

## 2021-03-07 DIAGNOSIS — M5451 Vertebrogenic low back pain: Secondary | ICD-10-CM | POA: Diagnosis not present

## 2021-03-12 DIAGNOSIS — M5416 Radiculopathy, lumbar region: Secondary | ICD-10-CM | POA: Diagnosis not present

## 2021-04-08 ENCOUNTER — Other Ambulatory Visit: Payer: Self-pay | Admitting: Family Medicine

## 2021-04-16 DIAGNOSIS — M5416 Radiculopathy, lumbar region: Secondary | ICD-10-CM | POA: Diagnosis not present

## 2021-05-17 DIAGNOSIS — M5431 Sciatica, right side: Secondary | ICD-10-CM | POA: Diagnosis not present

## 2021-05-17 DIAGNOSIS — G4709 Other insomnia: Secondary | ICD-10-CM | POA: Diagnosis not present

## 2021-05-28 DIAGNOSIS — M5431 Sciatica, right side: Secondary | ICD-10-CM | POA: Diagnosis not present

## 2021-06-20 DIAGNOSIS — M5416 Radiculopathy, lumbar region: Secondary | ICD-10-CM | POA: Diagnosis not present

## 2021-06-24 DIAGNOSIS — D1801 Hemangioma of skin and subcutaneous tissue: Secondary | ICD-10-CM | POA: Diagnosis not present

## 2021-06-24 DIAGNOSIS — D225 Melanocytic nevi of trunk: Secondary | ICD-10-CM | POA: Diagnosis not present

## 2021-06-24 DIAGNOSIS — L821 Other seborrheic keratosis: Secondary | ICD-10-CM | POA: Diagnosis not present

## 2021-06-24 DIAGNOSIS — L570 Actinic keratosis: Secondary | ICD-10-CM | POA: Diagnosis not present

## 2021-06-24 DIAGNOSIS — L814 Other melanin hyperpigmentation: Secondary | ICD-10-CM | POA: Diagnosis not present

## 2021-06-24 DIAGNOSIS — L300 Nummular dermatitis: Secondary | ICD-10-CM | POA: Diagnosis not present

## 2021-07-02 DIAGNOSIS — M5136 Other intervertebral disc degeneration, lumbar region: Secondary | ICD-10-CM | POA: Diagnosis not present

## 2021-07-02 DIAGNOSIS — M5416 Radiculopathy, lumbar region: Secondary | ICD-10-CM | POA: Diagnosis not present

## 2021-08-05 ENCOUNTER — Telehealth (INDEPENDENT_AMBULATORY_CARE_PROVIDER_SITE_OTHER): Payer: Medicare PPO

## 2021-08-05 VITALS — BP 121/71 | Ht 71.0 in | Wt 195.0 lb

## 2021-08-05 DIAGNOSIS — Z0000 Encounter for general adult medical examination without abnormal findings: Secondary | ICD-10-CM | POA: Diagnosis not present

## 2021-08-05 NOTE — Progress Notes (Signed)
? ?Subjective:  ? Adam Choi. is a 76 y.o. male who presents for Medicare Annual/Subsequent preventive examination. ? ?Review of Systems    ?Virtual Visit via Telephone Note ? ?I connected with  Adam Choi. on 08/05/21 at 12:30 PM EDT by telephone and verified that I am speaking with the correct person using two identifiers. ? ?Location: ?Patient: Home ?Provider: Office ?Persons participating in the virtual visit: patient/Nurse Health Advisor ?  ?I discussed the limitations, risks, security and privacy concerns of performing an evaluation and management service by telephone and the availability of in person appointments. The patient expressed understanding and agreed to proceed. ? ?Interactive audio and video telecommunications were attempted between this nurse and patient, however failed, due to patient having technical difficulties OR patient did not have access to video capability.  We continued and completed visit with audio only. ? ?Some vital signs may be absent or patient reported.  ? ?Criselda Peaches, LPN  ?Cardiac Risk Factors include: advanced age (>15mn, >>94women);hypertension;male gender ? ?   ?Objective:  ?  ?Today's Vitals  ? 08/05/21 1235  ?BP: 121/71  ?Weight: 195 lb (88.5 kg)  ?Height: '5\' 11"'$  (1.803 m)  ? ?Body mass index is 27.2 kg/m?. ? ? ?  08/05/2021  ? 12:50 PM 08/01/2020  ? 11:11 AM  ?Advanced Directives  ?Does Patient Have a Medical Advance Directive? Yes Yes  ?Type of [place]  ?Does patient want to make changes to medical advance directive? No - Patient declined   ?Copy of [place] Chart? No - copy requested No - copy requested  ? ? ?Current Medications (verified) ?Outpatient Encounter Medications as of 08/05/2021  ?Medication Sig  ? traZODone (DESYREL) 50 MG tablet Take 50 mg by mouth at bedtime.  ? gabapentin (NEURONTIN) 600 MG tablet TAKE 1 AND 1/2 TABLETS BY  MOUTH AT BEDTIME  ? lisinopril (ZESTRIL) 10 MG tablet TAKE 1 TABLET BY MOUTH EVERY DAY  ? simvastatin (ZOCOR) 20 MG tablet TAKE 1 TABLET BY MOUTH EVERY DAY IN THE EVENING  ? ?No facility-administered encounter medications on file as of 08/05/2021.  ? ? ?Allergies (verified) ?Patient has no known allergies.  ? ?History: ?Past Medical History:  ?Diagnosis Date  ? Arthritis   ? Chicken pox   ? Colon polyp 2012  ? Fainting spell   ? GERD (gastroesophageal reflux disease)   ? ?Past Surgical History:  ?Procedure Laterality Date  ? FOOT SURGERY  04/01/2011  ? halux rigidus/bones spur  ? REPLACEMENT TOTAL HIP W/  RESURFACING IMPLANTS Right   ? ?Family History  ?Problem Relation Age of Onset  ? Cancer Mother   ?     breast  ? Stroke Mother   ? Diabetes Mother   ?     type ll  ? Emphysema Father   ? Cancer Sister [phone] ?     lung cancer  ? ?Social History  ? ?Socioeconomic History  ? Marital status: Married  ?  Spouse name: Not on file  ? Number of children: Not on file  ? Years of education: Not on file  ? Highest education level: Not on file  ?Occupational History  ? Not on file  ?Tobacco Use  ? Smoking status: Former  ?  Packs/day: 1.00  ?  Years: 30.00  ?  Pack years: 30.00  ?  Types: Cigarettes  ?  Quit date: 04/23/2008  ?  Years since quitting: 13.2  ? Smokeless tobacco: Never  ?Vaping Use  ? Vaping Use: Never used  ?Substance and Sexual Activity  ? Alcohol use: Not on file  ? Drug use: Not on file  ? Sexual activity: Not on file  ?Other Topics Concern  ? Not on file  ?Social History Narrative  ? Not on file  ? ?Social Determinants of Health  ? ?Financial Resource Strain: Low Risk   ? Difficulty of Paying Living Expenses: Not very hard  ?Food Insecurity: No Food Insecurity  ? Worried About Charity fundraiser in the Last Year: Never true  ? Ran Out of Food in the Last Year: Never true  ?Transportation Needs: No Transportation Needs  ? Lack of Transportation (Medical): No  ? Lack of Transportation (Non-Medical): No  ?Physical  Activity: Insufficiently Active  ? Days of Exercise per Week: 2 days  ? Minutes of Exercise per Session: 50 min  ?Stress: No Stress Concern Present  ? Feeling of Stress : Not at all  ?Social Connections: Socially Integrated  ? Frequency of Communication with Friends and Family: More than three times a week  ? Frequency of Social Gatherings with Friends and Family: More than three times a week  ? Attends Religious Services: More than 4 times per year  ? Active Member of Clubs or Organizations: Yes  ? Attends Archivist Meetings: More than 4 times per year  ? Marital Status: Married  ? ? ? ?Clinical Intake: ? ?Pre-visit preparation completed: No ?Diabetic?  No ? ? ?Activities of Daily Living ? ?  08/05/2021  ? 12:49 PM  ?In your present state of health, do you have any difficulty performing the following activities:  ?Hearing? 0  ?Vision? 0  ?Difficulty concentrating or making decisions? 0  ?Walking or climbing stairs? 0  ?Dressing or bathing? 0  ?Doing errands, shopping? 0  ?Preparing Food and eating ? N  ?Using the Toilet? N  ?In the past six months, have you accidently leaked urine? N  ?Do you have problems with loss of bowel control? N  ?Managing your Medications? N  ?Managing your Finances? N  ?Housekeeping or managing your Housekeeping? N  ? ? ?Patient Care Team: ?Eulas Post, MD as PCP - General (Family Medicine) ? ?Indicate any recent Medical Services you may have received from other than [facility] providers in the past year (date may be approximate). ? ?   ?Assessment:  ? This is a routine wellness examination for [facility]. ? ?Hearing/Vision screen ?Hearing Screening - Comments:: No hearing difficulty ?Vision Screening - Comments:: Wears glasses. Followed by Dr Herbert Deaner ? ?Dietary issues and exercise activities discussed: ?Exercise limited by: None identified ? ? Goals Addressed   ? ?  ?  ?  ?  ?  ? This Visit's Progress  ?   Increase physical activity (pt-stated)     ? ?  ? ?Depression Screen ? ?   08/05/2021  ? 12:46 PM 08/01/2020  ? 11:10 AM 04/24/2020  ?  9:43 AM 12/28/2018  ?  9:33 AM 12/25/2017  ?  9:20 AM 02/12/2014  ? 11:31 AM 02/10/2014  ?  9:58 AM  ?PHQ 2/9 Scores  ?PHQ - 2 Score 0 0 0 0 0 0 0  ?PHQ- 9 Score   2      ?  ?Fall Risk ? ?  08/05/2021  ? 12:50 PM 08/01/2020  ? 11:13 AM 04/24/2020  ?  9:37 AM 12/25/2017  ?  9:20  AM 09/08/2016  ?  8:43 AM  ?Fall Risk   ?Falls in the past year? 0 0 0 No Yes  ?Number falls in past yr: 0 0   1  ?Injury with Fall? 0 0     ?Risk for fall due to : No Fall Risks Impaired vision     ?Follow up  Falls prevention discussed     ? ? ?FALL RISK PREVENTION PERTAINING TO THE HOME: ? ?Any stairs in or around the home? Yes  ?If so, are there any without handrails? No  ?Home free of loose throw rugs in walkways, pet beds, electrical cords, etc? Yes  ?Adequate lighting in your home to reduce risk of falls? Yes  ? ?ASSISTIVE DEVICES UTILIZED TO PREVENT FALLS: ? ?Life alert? No  ?Use of a cane, walker or w/c? No  ?Grab bars in the bathroom? Yes  ?Shower chair or bench in shower? Yes  ?Elevated toilet seat or a handicapped toilet? Yes  ? ?TIMED UP AND GO: ? ?Was the test performed? No . Audio Visit ? ?Cognitive Function: ?  ?  ? ?  08/05/2021  ? 12:50 PM 08/01/2020  ? 11:15 AM  ?6CIT Screen  ?What Year? 0 points 0 points  ?What month? 0 points 0 points  ?What time? 0 points   ?Count back from 20 0 points 0 points  ?Months in reverse 0 points 0 points  ?Repeat phrase 0 points 0 points  ?Total Score 0 points   ? ? ?Immunizations ?Immunization History  ?Administered Date(s) Administered  ? Fluad Quad(high Dose 65+) 12/28/2018  ? Influenza Split 01/14/2012  ? Influenza, High Dose Seasonal PF 02/02/2015, 12/04/2015, 12/25/2017, 01/15/2021  ? Influenza,inj,Quad PF,6+ Mos 12/13/2012, 01/04/2014  ? Influenza-Unspecified 11/20/2016, 01/13/2017, 12/26/2019  ? PFIZER(Purple Top)SARS-COV-2 Vaccination 04/20/2019, 05/11/2019, 12/26/2019  ? Pension scheme manager 29yr & up 02/27/2021  ?  Pneumococcal Conjugate-13 02/10/2014  ? Pneumococcal Polysaccharide-23 12/13/2012  ? Td 01/07/2019  ? Zoster Recombinat (Shingrix) 05/22/2020  ? ? ?TDAP status: Up to date ? ?Flu Vaccine status: Up to date ? ?P

## 2021-08-05 NOTE — Patient Instructions (Addendum)
?[name] , ?Thank you for taking time to come for your Medicare Wellness Visit. I appreciate your ongoing commitment to your health goals. Please review the following plan we discussed and let me know if I can assist you in the future.  ? ?These are the goals we discussed: ? Goals   ? ?   Increase physical activity (pt-stated)   ?   Patient Stated   ?   Get back to exercise ?  ? ?  ?  ?This is a list of the screening recommended for you and due dates:  ?Health Maintenance  ?Topic Date Due  ? Zoster (Shingles) Vaccine (2 of 2) 07/17/2020  ? Colon Cancer Screening  02/05/2021  ? Flu Shot  10/29/2021  ? Tetanus Vaccine  01/06/2029  ? Pneumonia Vaccine  Completed  ? COVID-19 Vaccine  Completed  ? Hepatitis C Screening: USPSTF Recommendation to screen - Ages 2-79 yo.  Completed  ? HPV Vaccine  Aged Out  ?  ? ?Advanced directives: Yes Patient will bring copy ? ?Conditions/risks identified: None ? ?Next appointment: Follow up in one year for your annual wellness visit.  ? ?Preventive Care 75 Years and Older, Male ?Preventive care refers to lifestyle choices and visits with your health care provider that can promote health and wellness. ?What does preventive care include? ?A yearly physical exam. This is also called an annual well check. ?Dental exams once or twice a year. ?Routine eye exams. Ask your health care provider how often you should have your eyes checked. ?Personal lifestyle choices, including: ?Daily care of your teeth and gums. ?Regular physical activity. ?Eating a healthy diet. ?Avoiding tobacco and drug use. ?Limiting alcohol use. ?Practicing safe sex. ?Taking low doses of aspirin every day. ?Taking vitamin and mineral supplements as recommended by your health care provider. ?What happens during an annual well check? ?The services and screenings done by your health care provider during your annual well check will depend on your age, overall health, lifestyle risk factors, and family history of  disease. ?Counseling  ?Your health care provider may ask you questions about your: ?Alcohol use. ?Tobacco use. ?Drug use. ?Emotional well-being. ?Home and relationship well-being. ?Sexual activity. ?Eating habits. ?History of falls. ?Memory and ability to understand (cognition). ?Work and work Statistician. ?Screening  ?You may have the following tests or measurements: ?Height, weight, and BMI. ?Blood pressure. ?Lipid and cholesterol levels. These may be checked every 5 years, or more frequently if you are over 70 years old. ?Skin check. ?Lung cancer screening. You may have this screening every year starting at age 52 if you have a 30-pack-year history of smoking and currently smoke or have quit within the past 15 years. ?Fecal occult blood test (FOBT) of the stool. You may have this test every year starting at age 54. ?Flexible sigmoidoscopy or colonoscopy. You may have a sigmoidoscopy every 5 years or a colonoscopy every 10 years starting at age 62. ?Prostate cancer screening. Recommendations will vary depending on your family history and other risks. ?Hepatitis C blood test. ?Hepatitis B blood test. ?Sexually transmitted disease (STD) testing. ?Diabetes screening. This is done by checking your blood sugar (glucose) after you have not eaten for a while (fasting). You may have this done every 1-3 years. ?Abdominal aortic aneurysm (AAA) screening. You may need this if you are a current or former smoker. ?Osteoporosis. You may be screened starting at age 73 if you are at high risk. ?Talk with your health care provider about your test  results, treatment options, and if necessary, the need for more tests. ?Vaccines  ?Your health care provider may recommend certain vaccines, such as: ?Influenza vaccine. This is recommended every year. ?Tetanus, diphtheria, and acellular pertussis (Tdap, Td) vaccine. You may need a Td booster every 10 years. ?Zoster vaccine. You may need this after age 64. ?Pneumococcal 13-valent  conjugate (PCV13) vaccine. One dose is recommended after age 48. ?Pneumococcal polysaccharide (PPSV23) vaccine. One dose is recommended after age 4. ?Talk to your health care provider about which screenings and vaccines you need and how often you need them. ?This information is not intended to replace advice given to you by your health care provider. Make sure you discuss any questions you have with your health care provider. ?Document Released: 04/13/2015 Document Revised: 12/05/2015 Document Reviewed: 01/16/2015 ?Elsevier Interactive Patient Education ? 2017 [place]. ? ?Fall Prevention in the Home ?Falls can cause injuries. They can happen to people of all ages. There are many things you can do to make your home safe and to help prevent falls. ?What can I do on the outside of my home? ?Regularly fix the edges of walkways and driveways and fix any cracks. ?Remove anything that might make you trip as you walk through a door, such as a raised step or threshold. ?Trim any bushes or trees on the path to your home. ?Use bright outdoor lighting. ?Clear any walking paths of anything that might make someone trip, such as rocks or tools. ?Regularly check to see if handrails are loose or broken. Make sure that both sides of any steps have handrails. ?Any raised decks and porches should have guardrails on the edges. ?Have any leaves, snow, or ice cleared regularly. ?Use sand or salt on walking paths during winter. ?Clean up any spills in your garage right away. This includes oil or grease spills. ?What can I do in the bathroom? ?Use night lights. ?Install grab bars by the toilet and in the tub and shower. Do not use towel bars as grab bars. ?Use non-skid mats or decals in the tub or shower. ?If you need to sit down in the shower, use a plastic, non-slip stool. ?Keep the floor dry. Clean up any water that spills on the floor as soon as it happens. ?Remove soap buildup in the tub or shower regularly. ?Attach bath mats  securely with double-sided non-slip rug tape. ?Do not have throw rugs and other things on the floor that can make you trip. ?What can I do in the bedroom? ?Use night lights. ?Make sure that you have a light by your bed that is easy to reach. ?Do not use any sheets or blankets that are too big for your bed. They should not hang down onto the floor. ?Have a firm chair that has side arms. You can use this for support while you get dressed. ?Do not have throw rugs and other things on the floor that can make you trip. ?What can I do in the kitchen? ?Clean up any spills right away. ?Avoid walking on wet floors. ?Keep items that you use a lot in easy-to-reach places. ?If you need to reach something above you, use a strong step stool that has a grab bar. ?Keep electrical cords out of the way. ?Do not use floor polish or wax that makes floors slippery. If you must use wax, use non-skid floor wax. ?Do not have throw rugs and other things on the floor that can make you trip. ?What can I do with my stairs? ?  Do not leave any items on the stairs. ?Make sure that there are handrails on both sides of the stairs and use them. Fix handrails that are broken or loose. Make sure that handrails are as long as the stairways. ?Check any carpeting to make sure that it is firmly attached to the stairs. Fix any carpet that is loose or worn. ?Avoid having throw rugs at the top or bottom of the stairs. If you do have throw rugs, attach them to the floor with carpet tape. ?Make sure that you have a light switch at the top of the stairs and the bottom of the stairs. If you do not have them, ask someone to add them for you. ?What else can I do to help prevent falls? ?Wear shoes that: ?Do not have high heels. ?Have rubber bottoms. ?Are comfortable and fit you well. ?Are closed at the toe. Do not wear sandals. ?If you use a stepladder: ?Make sure that it is fully opened. Do not climb a closed stepladder. ?Make sure that both sides of the stepladder  are locked into place. ?Ask someone to hold it for you, if possible. ?Clearly mark and make sure that you can see: ?Any grab bars or handrails. ?First and last steps. ?Where the edge of each step is. ?Use tools that hel

## 2021-08-07 ENCOUNTER — Ambulatory Visit: Payer: Medicare PPO

## 2021-08-07 ENCOUNTER — Other Ambulatory Visit: Payer: Self-pay | Admitting: Family Medicine

## 2021-08-27 NOTE — Progress Notes (Signed)
This encounter was created in error - please disregard.

## 2021-09-17 ENCOUNTER — Encounter: Payer: Self-pay | Admitting: Family Medicine

## 2021-09-17 DIAGNOSIS — Z86010 Personal history of colonic polyps: Secondary | ICD-10-CM | POA: Diagnosis not present

## 2021-09-17 DIAGNOSIS — K635 Polyp of colon: Secondary | ICD-10-CM | POA: Diagnosis not present

## 2021-09-17 DIAGNOSIS — K648 Other hemorrhoids: Secondary | ICD-10-CM | POA: Diagnosis not present

## 2021-09-17 DIAGNOSIS — Z1211 Encounter for screening for malignant neoplasm of colon: Secondary | ICD-10-CM | POA: Diagnosis not present

## 2021-09-17 DIAGNOSIS — D123 Benign neoplasm of transverse colon: Secondary | ICD-10-CM | POA: Diagnosis not present

## 2021-09-17 DIAGNOSIS — K5730 Diverticulosis of large intestine without perforation or abscess without bleeding: Secondary | ICD-10-CM | POA: Diagnosis not present

## 2021-09-17 LAB — HM COLONOSCOPY

## 2021-09-22 LAB — HM COLONOSCOPY

## 2021-09-24 ENCOUNTER — Encounter: Payer: Self-pay | Admitting: Family Medicine

## 2021-09-27 ENCOUNTER — Other Ambulatory Visit: Payer: Self-pay | Admitting: Family Medicine

## 2021-10-08 ENCOUNTER — Ambulatory Visit: Payer: Medicare PPO | Admitting: Family Medicine

## 2021-10-08 ENCOUNTER — Encounter: Payer: Self-pay | Admitting: Family Medicine

## 2021-10-08 VITALS — BP 106/60 | HR 70 | Temp 98.0°F | Ht 71.0 in | Wt 194.0 lb

## 2021-10-08 DIAGNOSIS — E785 Hyperlipidemia, unspecified: Secondary | ICD-10-CM

## 2021-10-08 DIAGNOSIS — I10 Essential (primary) hypertension: Secondary | ICD-10-CM | POA: Diagnosis not present

## 2021-10-08 MED ORDER — SIMVASTATIN 20 MG PO TABS: ORAL_TABLET | ORAL | 3 refills | Status: DC

## 2021-10-08 MED ORDER — LISINOPRIL 10 MG PO TABS: 10.0000 mg | ORAL_TABLET | Freq: Every day | ORAL | 3 refills | Status: DC

## 2021-10-08 NOTE — Progress Notes (Signed)
Established Patient Office Visit  Subjective   Patient ID: Adam Fosberg., male    DOB: 27-Mar-1946  Age: 76 y.o. MRN: [id number]  Chief Complaint  Patient presents with   Medication Consultation    HPI   Here for medical follow-up.  He needs refills of lisinopril and simvastatin.  Last lipids were checked in January 22.  Denies any myalgias from simvastatin.  Compliant with therapy.  We had reduced his lisinopril from 20 mg to 10 mg and he has had no further dizziness.  He stays active with exercise.  Recent low-dose CT lung cancer screen unremarkable.  Past Medical History:  Diagnosis Date   Arthritis    Chicken pox    Colon polyp 2012   Fainting spell    GERD (gastroesophageal reflux disease)    Past Surgical History:  Procedure Laterality Date   FOOT SURGERY  04/01/2011   halux rigidus/bones spur   REPLACEMENT TOTAL HIP W/  RESURFACING IMPLANTS Right     reports that he quit smoking about 13 years ago. His smoking use included cigarettes. He has a 30.00 pack-year smoking history. He has never used smokeless tobacco. No history on file for alcohol use and drug use. family history includes Cancer in his mother; Cancer (age of onset: 13) in his sister; Diabetes in his mother; Emphysema in his father; Stroke in his mother. No Known Allergies  Review of Systems  Constitutional:  Negative for malaise/fatigue.  Eyes:  Negative for blurred vision.  Respiratory:  Negative for shortness of breath.   Cardiovascular:  Negative for chest pain.  Neurological:  Negative for dizziness, weakness and headaches.      Objective:     BP 106/60 (BP Location: Left Arm, Patient Position: Sitting, Cuff Size: Normal)   Pulse 70   Temp 98 F (36.7 C) (Oral)   Ht '5\' 11"'$  (1.803 m)   Wt 194 lb (88 kg)   SpO2 96%   BMI 27.06 kg/m    Physical Exam Constitutional:      Appearance: He is well-developed.  HENT:     Right Ear: External ear normal.     Left Ear: External ear  normal.  Eyes:     Pupils: Pupils are equal, round, and reactive to light.  Neck:     Thyroid: No thyromegaly.  Cardiovascular:     Rate and Rhythm: Normal rate and regular rhythm.  Pulmonary:     Effort: Pulmonary effort is normal. No respiratory distress.     Breath sounds: Normal breath sounds. No wheezing or rales.  Musculoskeletal:     Cervical back: Neck supple.     Right lower leg: No edema.     Left lower leg: No edema.  Neurological:     Mental Status: He is alert and oriented to person, place, and time.      No results found for any visits on 10/08/21.    The 10-year ASCVD risk score (Arnett DK, et al., 2019) is: 17%    Assessment & Plan:   Problem List Items Addressed This Visit       Unprioritized   Hyperlipidemia   Relevant Medications   simvastatin (ZOCOR) 20 MG tablet   lisinopril (ZESTRIL) 10 MG tablet   Other Relevant Orders   Lipid panel   Hepatic function panel   Hypertension - Primary   Relevant Medications   simvastatin (ZOCOR) 20 MG tablet   lisinopril (ZESTRIL) 10 MG tablet   Other Relevant Orders  Basic metabolic panel  Hypertension stable.  Refill lisinopril for 1 year.  Future lab order for basic metabolic panel  Hyperlipidemia treated with simvastatin.  Future lab order for lipid and hepatic panel.  Refill simvastatin for 1 year  No follow-ups on file.    Carolann Littler, MD

## 2021-10-09 ENCOUNTER — Other Ambulatory Visit (INDEPENDENT_AMBULATORY_CARE_PROVIDER_SITE_OTHER): Payer: Medicare PPO

## 2021-10-09 DIAGNOSIS — I10 Essential (primary) hypertension: Secondary | ICD-10-CM | POA: Diagnosis not present

## 2021-10-09 DIAGNOSIS — E785 Hyperlipidemia, unspecified: Secondary | ICD-10-CM | POA: Diagnosis not present

## 2021-10-09 LAB — HEPATIC FUNCTION PANEL
ALT: 12 U/L (ref 0–53)
AST: 17 U/L (ref 0–37)
Albumin: 4.4 g/dL (ref 3.5–5.2)
Alkaline Phosphatase: 68 U/L (ref 39–117)
Bilirubin, Direct: 0.1 mg/dL (ref 0.0–0.3)
Total Bilirubin: 0.7 mg/dL (ref 0.2–1.2)
Total Protein: 7.2 g/dL (ref 6.0–8.3)

## 2021-10-09 LAB — BASIC METABOLIC PANEL
BUN: 10 mg/dL (ref 6–23)
CO2: 31 mEq/L (ref 19–32)
Calcium: 10.3 mg/dL (ref 8.4–10.5)
Chloride: 101 mEq/L (ref 96–112)
Creatinine, Ser: 1.18 mg/dL (ref 0.40–1.50)
GFR: 60.76 mL/min (ref >60.00)
Glucose, Bld: 86 mg/dL (ref 70–99)
Potassium: 4.9 mEq/L (ref 3.5–5.1)
Sodium: 137 mEq/L (ref 135–145)

## 2021-10-09 LAB — LIPID PANEL
Cholesterol: 197 mg/dL (ref 0–200)
HDL: 75.1 mg/dL (ref >39.00)
LDL Cholesterol: 104 mg/dL — ABNORMAL HIGH (ref 0–99)
NonHDL: 121.68
Total CHOL/HDL Ratio: 3
Triglycerides: 88 mg/dL (ref 0.0–149.0)
VLDL: 17.6 mg/dL (ref 0.0–40.0)

## 2021-11-21 ENCOUNTER — Other Ambulatory Visit: Payer: Self-pay | Admitting: Family Medicine

## 2021-11-28 DIAGNOSIS — Z96641 Presence of right artificial hip joint: Secondary | ICD-10-CM | POA: Diagnosis not present

## 2022-01-24 ENCOUNTER — Ambulatory Visit (HOSPITAL_BASED_OUTPATIENT_CLINIC_OR_DEPARTMENT_OTHER)
Admission: RE | Admit: 2022-01-24 | Discharge: 2022-01-24 | Disposition: A | Discharge status: Home / Self Care | Payer: Medicare PPO | Source: Ambulatory Visit | Attending: Acute Care | Admitting: Acute Care

## 2022-01-24 DIAGNOSIS — Z87891 Personal history of nicotine dependence: Secondary | ICD-10-CM | POA: Diagnosis not present

## 2022-01-30 ENCOUNTER — Other Ambulatory Visit: Payer: Self-pay

## 2022-01-30 DIAGNOSIS — Z122 Encounter for screening for malignant neoplasm of respiratory organs: Secondary | ICD-10-CM

## 2022-01-30 DIAGNOSIS — Z87891 Personal history of nicotine dependence: Secondary | ICD-10-CM

## 2022-02-11 DIAGNOSIS — L718 Other rosacea: Secondary | ICD-10-CM | POA: Diagnosis not present

## 2022-02-11 DIAGNOSIS — L821 Other seborrheic keratosis: Secondary | ICD-10-CM | POA: Diagnosis not present

## 2022-02-20 ENCOUNTER — Other Ambulatory Visit: Payer: Self-pay | Admitting: Family Medicine

## 2022-04-23 DIAGNOSIS — D3131 Benign neoplasm of right choroid: Secondary | ICD-10-CM | POA: Diagnosis not present

## 2022-04-23 DIAGNOSIS — H2513 Age-related nuclear cataract, bilateral: Secondary | ICD-10-CM | POA: Diagnosis not present

## 2022-04-23 DIAGNOSIS — H25013 Cortical age-related cataract, bilateral: Secondary | ICD-10-CM | POA: Diagnosis not present

## 2022-04-23 DIAGNOSIS — H35373 Puckering of macula, bilateral: Secondary | ICD-10-CM | POA: Diagnosis not present

## 2022-04-23 DIAGNOSIS — H524 Presbyopia: Secondary | ICD-10-CM | POA: Diagnosis not present

## 2022-06-11 DIAGNOSIS — D1801 Hemangioma of skin and subcutaneous tissue: Secondary | ICD-10-CM | POA: Diagnosis not present

## 2022-06-11 DIAGNOSIS — Z85828 Personal history of other malignant neoplasm of skin: Secondary | ICD-10-CM | POA: Diagnosis not present

## 2022-06-11 DIAGNOSIS — D485 Neoplasm of uncertain behavior of skin: Secondary | ICD-10-CM | POA: Diagnosis not present

## 2022-06-11 DIAGNOSIS — L821 Other seborrheic keratosis: Secondary | ICD-10-CM | POA: Diagnosis not present

## 2022-06-11 DIAGNOSIS — L570 Actinic keratosis: Secondary | ICD-10-CM | POA: Diagnosis not present

## 2022-06-11 DIAGNOSIS — L814 Other melanin hyperpigmentation: Secondary | ICD-10-CM | POA: Diagnosis not present

## 2022-07-01 ENCOUNTER — Other Ambulatory Visit: Payer: Self-pay | Admitting: Family Medicine

## 2022-07-29 ENCOUNTER — Telehealth: Payer: Self-pay | Admitting: Family Medicine

## 2022-07-29 NOTE — Telephone Encounter (Signed)
Contacted Adam Choi. to schedule their annual wellness visit. Appointment made for 08/07/22.  Adam Choi AWV direct phone # [phone]   Due to schedule change moved 5/9 awv appt to [name] schedule

## 2022-08-04 ENCOUNTER — Encounter: Payer: Self-pay | Admitting: Family Medicine

## 2022-08-04 ENCOUNTER — Ambulatory Visit (INDEPENDENT_AMBULATORY_CARE_PROVIDER_SITE_OTHER): Payer: Medicare PPO | Admitting: Family Medicine

## 2022-08-04 VITALS — BP 128/76 | HR 74 | Temp 98.3°F | Wt 190.0 lb

## 2022-08-04 DIAGNOSIS — R944 Abnormal results of kidney function studies: Secondary | ICD-10-CM

## 2022-08-04 DIAGNOSIS — R6889 Other general symptoms and signs: Secondary | ICD-10-CM

## 2022-08-04 LAB — BASIC METABOLIC PANEL
BUN: 12 mg/dL (ref 6–23)
CO2: 31 mEq/L (ref 19–32)
Calcium: 10.2 mg/dL (ref 8.4–10.5)
Chloride: 99 mEq/L (ref 96–112)
Creatinine, Ser: 1.38 mg/dL (ref 0.40–1.50)
GFR: 50.07 mL/min — ABNORMAL LOW (ref >60.00)
Glucose, Bld: 107 mg/dL — ABNORMAL HIGH (ref 70–99)
Potassium: 4.3 mEq/L (ref 3.5–5.1)
Sodium: 138 mEq/L (ref 135–145)

## 2022-08-04 LAB — CBC WITH DIFFERENTIAL/PLATELET
Basophils Absolute: 0.1 10*3/uL (ref 0.0–0.1)
Basophils Relative: 0.7 % (ref 0.0–3.0)
Eosinophils Absolute: 0 10*3/uL (ref 0.0–0.7)
Eosinophils Relative: 0.5 % (ref 0.0–5.0)
HCT: 45.4 % (ref 39.0–52.0)
Hemoglobin: 15.3 g/dL (ref 13.0–17.0)
Lymphocytes Relative: 20.9 % (ref 12.0–46.0)
Lymphs Abs: 1.4 10*3/uL (ref 0.7–4.0)
MCHC: 33.7 g/dL (ref 30.0–36.0)
MCV: 92.2 fl (ref 78.0–100.0)
Monocytes Absolute: 0.6 10*3/uL (ref 0.1–1.0)
Monocytes Relative: 9.5 % (ref 3.0–12.0)
Neutro Abs: 4.7 10*3/uL (ref 1.4–7.7)
Neutrophils Relative %: 68.4 % (ref 43.0–77.0)
Platelets: 219 10*3/uL (ref 150.0–400.0)
RBC: 4.92 Mil/uL (ref 4.22–5.81)
RDW: 13.5 % (ref 11.5–15.5)
WBC: 6.8 10*3/uL (ref 4.0–10.5)

## 2022-08-04 LAB — TSH: TSH: 0.89 u[IU]/mL (ref 0.35–5.50)

## 2022-08-04 LAB — HEMOGLOBIN A1C: Hgb A1c MFr Bld: 5.7 % (ref 4.6–6.5)

## 2022-08-04 LAB — VITAMIN B12: Vitamin B-12: 167 pg/mL — ABNORMAL LOW (ref 211–911)

## 2022-08-04 NOTE — Progress Notes (Signed)
   Subjective:    Patient ID: Adam Choi., male    DOB: 08/24/1945, 77 y.o.   MRN: [id number]  HPI Here with his wife for 4 brief spells yesterday of unresponsiveness. He was sitting down each time this occurred. He does not remember anything about these spells, so most of the history comes from his wife. She witnessed all four of these. She says he would suddenly sit very still, his eyes seemed to lose their focus, and he would not respond to her. These all lasted between 10 and 30 seconds. No clenching or shaking. No slurred speech or trouble walking. He denies any headaches. No recent medication changes. So far today he has not had any more spells and he feels fine.    Review of Systems  Constitutional: Negative.   Respiratory: Negative.    Cardiovascular: Negative.   Gastrointestinal: Negative.   Genitourinary: Negative.   Neurological:  Negative for dizziness, tremors, syncope, facial asymmetry, speech difficulty, weakness, light-headedness, numbness and headaches.       Objective:   Physical Exam Constitutional:      Appearance: Normal appearance. He is not ill-appearing.  Cardiovascular:     Rate and Rhythm: Normal rate and regular rhythm.     Pulses: Normal pulses.     Heart sounds: Normal heart sounds.  Pulmonary:     Effort: Pulmonary effort is normal.     Breath sounds: Normal breath sounds.  Neurological:     General: No focal deficit present.     Mental Status: He is alert and oriented to person, place, and time.     Cranial Nerves: No cranial nerve deficit.     Motor: No weakness.     Coordination: Coordination normal.     Gait: Gait normal.           Assessment & Plan:  He has had four spells of unresponsiveness, and the etiology of these is unclear. Possible etiologies include absence seizures or TIA's. We will get labs today and we will set up a brain MRI. He will report back to [place] if this happens again.  Gershon Crane, MD

## 2022-08-06 ENCOUNTER — Encounter: Payer: Self-pay | Admitting: Family Medicine

## 2022-08-06 NOTE — Telephone Encounter (Signed)
See results note forwarded to PCP.

## 2022-08-07 ENCOUNTER — Ambulatory Visit (INDEPENDENT_AMBULATORY_CARE_PROVIDER_SITE_OTHER): Payer: Medicare PPO | Admitting: Family Medicine

## 2022-08-07 ENCOUNTER — Encounter: Payer: Self-pay | Admitting: Family Medicine

## 2022-08-07 DIAGNOSIS — Z0000 Encounter for general adult medical examination without abnormal findings: Secondary | ICD-10-CM

## 2022-08-07 NOTE — Addendum Note (Signed)
Addended by: Johnella Moloney on: 08/07/2022 10:52 AM   Modules accepted: Orders

## 2022-08-07 NOTE — Patient Instructions (Signed)
I really enjoyed getting to talk with you today! I am available on Tuesdays and Thursdays for virtual visits if you have any questions or concerns, or if I can be of any further assistance.   CHECKLIST FROM ANNUAL WELLNESS VISIT:  -Follow up (please call to schedule if not scheduled after visit):   -yearly for annual wellness visit with primary care office  Here is a list of your preventive care/health maintenance measures and the plan for each if any are due:  PLAN For any measures below that may be due:   Health Maintenance  Topic Date Due   Zoster Vaccines- Shingrix (2 of 2) 07/17/2020   COVID-19 Vaccine (6 - 2023-24 season) 03/20/2022   INFLUENZA VACCINE  10/30/2022   Lung Cancer Screening  01/25/2023   Medicare Annual Wellness (AWV)  08/07/2023   DTaP/Tdap/Td (2 - Tdap) 01/06/2029   COLONOSCOPY (Pts 45-61yrs Insurance coverage will need to be confirmed)  09/23/2031   Pneumonia Vaccine 64+ Years old  Completed   Hepatitis C Screening  Completed   HPV VACCINES  Aged Out    -See a dentist at least yearly  -Get your eyes checked and then per your eye specialist's recommendations  -Other issues addressed today:   -I have included below further information regarding a healthy whole foods based diet, physical activity guidelines for adults, stress management and opportunities for social connections. I hope you find this information useful.   -----------------------------------------------------------------------------------------------------------------------------------------------------------------------------------------------------------------------------------------------------------  NUTRITION: -eat real food: lots of colorful vegetables (half the plate) and fruits -5-7 servings of vegetables and fruits per day (fresh or steamed is best), exp. 2 servings of vegetables with lunch and dinner and 2 servings of fruit per day. Berries and greens such as kale and collards are  great choices.  -consume on a regular basis: whole grains (make sure first ingredient on label contains the word "whole"), fresh fruits, fish, nuts, seeds, healthy oils (such as olive oil, avocado oil, grape seed oil) -may eat small amounts of dairy and lean meat on occasion, but avoid processed meats such as ham, bacon, lunch meat, etc. -drink water -try to avoid fast food and pre-packaged foods, processed meat -most experts advise limiting sodium to < 2300mg  per day, should limit further is any chronic conditions such as high blood pressure, heart disease, diabetes, etc. The American Heart Association advised that < 1500mg  is is ideal -try to avoid foods that contain any ingredients with names you do not recognize  -try to avoid sugar/sweets (except for the natural sugar that occurs in fresh fruit) -try to avoid sweet drinks -try to avoid white rice, white bread, pasta (unless whole grain), white or yellow potatoes  EXERCISE GUIDELINES FOR ADULTS: -if you wish to increase your physical activity, do so gradually and with the approval of your doctor -STOP and seek medical care immediately if you have any chest pain, chest discomfort or trouble breathing when starting or increasing exercise  -move and stretch your body, legs, feet and arms when sitting for long periods -Physical activity guidelines for optimal health in adults: -least 150 minutes per week of aerobic exercise (can talk, but not sing) once approved by your doctor, 20-30 minutes of sustained activity or two 10 minute episodes of sustained activity every day.  -resistance training at least 2 days per week if approved by your doctor -balance exercises 3+ days per week:   Stand somewhere where you have something sturdy to hold onto if you lose balance.    1) lift  up on toes, start with 5x per day and work up to 20x   2) stand and lift on leg straight out to the side so that foot is a few inches of the floor, start with 5x each side and  work up to 20x each side   3) stand on one foot, start with 5 seconds each side and work up to 20 seconds on each side  If you need ideas or help with getting more active:  -Silver sneakers https://tools.silversneakers.com  -Walk with a Doc: http://www.duncan-williams.com/  -try to include resistance (weight lifting/strength building) and balance exercises twice per week: or the following link for ideas: http://castillo-powell.com/  BuyDucts.dk  STRESS MANAGEMENT: -can try meditating, or just sitting quietly with deep breathing while intentionally relaxing all parts of your body for 5 minutes daily -if you need further help with stress, anxiety or depression please follow up with your primary doctor or contact the wonderful folks at [facility]: [phone]  SOCIAL CONNECTIONS: -options in [place] if you wish to engage in more social and exercise related activities:  -Silver sneakers https://tools.silversneakers.com  -Walk with a Doc: http://www.duncan-williams.com/  -Check out the [facility] Active Adults 50+ section on the [place] [employer] (hiking clubs, book clubs, cards and games, chess, exercise classes, aquatic classes and much more) - see the website for details: https://www.[place].gov/departments/parks-recreation/active-adults50  -YouTube has lots of exercise videos for different ages and abilities as well  -[facility] (a variety of indoor and outdoor inperson activities for adults). [phone]. [address].  -Virtual Online Classes (a variety of topics): see seniorplanet.org or call 270-536-3079  -consider volunteering at a school, hospice center, church, senior center or elsewhere

## 2022-08-07 NOTE — Progress Notes (Signed)
PATIENT CHECK-IN and HEALTH RISK ASSESSMENT QUESTIONNAIRE:  -completed by phone/video for upcoming Medicare Preventive Visit  Pre-Visit Check-in: 1)Vitals (height, wt, BP, etc) - record in vitals section for visit on day of visit 2)Review and Update Medications, Allergies PMH, Surgeries, Social history in Epic 3)Hospitalizations in the last year with date/reason? No  4)Review and Update Care Team (patient's specialists) in Epic 5) Complete PHQ9 in Epic  6) Complete Fall Screening in Epic 7)Review all Health Maintenance Due and order under PCP if not done.  Medicare Wellness Patient Questionnaire:  Answer theses question about your habits: Do you drink alcohol? Yes  If yes, how many drinks do you have a day? 2 glasses wine Have you ever smoked? Yes Quit date if applicable? 2010  How many packs a day do/did you smoke? Smoked for a long time - gets lung screenings Do you use smokeless tobacco?No Do you use an illicit drugs?No Do you exercises? Yes IF so, what type and how many days/minutes per week?Walking 3 days/wk - 30-45 minutes, resistance training, recumbent bike Are you sexually active? No Number of partners? N/a Typical breakfast Bagel; oatmeal; grits, eggs Typical lunch Protein, 2 veggies; salad Typical dinner more of snacking Typical snacks: cheese & crackers; nuts; raw veg; trailmix; pretzel  Beverages: water, sweet tea  Answer theses question about you: Can you perform most household chores? Yes Do you find it hard to follow a conversation in a noisy room? no Do you often ask people to speak up or repeat themselves? no Do you feel that you have a problem with memory?no  Do you balance your checkbook and or bank acounts?Yes Do you feel safe at home?Yes Last dentist visit? 3 months ago Do you need assistance with any of the following: Please note if so Does not need assistance with any  Driving?  Feeding yourself?  Getting from bed to chair?  Getting to the  toilet?  Bathing or showering?  Dressing yourself?  Managing money?  Climbing a flight of stairs  Preparing meals?    Do you have Advanced Directives in place (Living Will, Healthcare Power or Attorney)? Yes; advised to bring in at next OV to be scanned into system. Pt verb understanding.   Last eye Exam and location? Last 6-9 months; Dr Elmer Picker at [facility]   Do you currently use prescribed or non-prescribed narcotic or opioid pain medications? no  Do you have a history or close family history of breast, ovarian, tubal or peritoneal cancer or a family member with BRCA (breast cancer susceptibility 1 and 2) gene mutations? Yes, mother, sister, daughter BRCA; daughter ovarian cancer  Nurse/Assistant Credentials/time stamp: Claudette Laws BSN, Editor, commissioning [facility] [place] Clinical RN Supervisor   ----------------------------------------------------------------------------------------------------------------------------------------------------------------------------------------------------------------------    MEDICARE ANNUAL PREVENTIVE CARE VISIT WITH PROVIDER (Welcome to [employer], initial annual wellness or annual wellness exam)  Virtual Visit via Video  Note  I connected with Aleksandar Heagerty. on 08/07/22  by a video enabled telemedicine application and verified that I am speaking with the correct person using two identifiers.  Location patient: home Location provider:work or home office Persons participating in the virtual visit: patient, provider  Concerns and/or follow up today: reports doing well, saw PCP in office recently for brief spells of feeling a little out of it. Since has resolved.    See HM section in Epic for other details of completed HM.    ROS: negative for report of fevers, unintentional weight loss, vision changes, vision loss, hearing loss  or change, chest pain, sob, palpitations, hemoptysis, melena, hematochezia, hematuria, falls,  bleeding or bruising, thoughts of suicide or self harm, memory loss  Patient-completed extensive health risk assessment - reviewed and discussed with the patient: See Health Risk Assessment completed with patient prior to the visit either above or in recent phone note. This was reviewed in detailed with the patient today and appropriate recommendations, orders and referrals were placed as needed per Summary below and patient instructions.   Review of Medical History: -PMH, PSH, Family History and current specialty and care providers reviewed and updated and listed below   Patient Care Team: Kristian Covey, MD as PCP - General (Family Medicine)   Past Medical History:  Diagnosis Date   Arthritis    Chicken pox    Colon polyp 2012   Fainting spell    GERD (gastroesophageal reflux disease)     Past Surgical History:  Procedure Laterality Date   FOOT SURGERY  04/01/2011   halux rigidus/bones spur   REPLACEMENT TOTAL HIP W/  RESURFACING IMPLANTS Right     Social History   Socioeconomic History   Marital status: Married    Spouse name: Not on file   Number of children: Not on file   Years of education: Not on file   Highest education level: Master's degree (e.g., MA, MS, MEng, MEd, MSW, MBA)  Occupational History   Not on file  Tobacco Use   Smoking status: Former    Packs/day: 1.00    Years: 30.00    Additional pack years: 0.00    Total pack years: 30.00    Types: Cigarettes    Quit date: 04/23/2008    Years since quitting: 14.2   Smokeless tobacco: Never  Vaping Use   Vaping Use: Never used  Substance and Sexual Activity   Alcohol use: Yes    Alcohol/week: 10.0 standard drinks of alcohol    Types: 10 Glasses of wine per week   Drug use: Never   Sexual activity: Not Currently  Other Topics Concern   Not on file  Social History Narrative   Not on file   Social Determinants of Health   Financial Resource Strain: Low Risk  (10/04/2021)   Overall Financial  Resource Strain (CARDIA)    Difficulty of Paying Living Expenses: Not hard at all  Food Insecurity: No Food Insecurity (10/04/2021)   Hunger Vital Sign    Worried About Running Out of Food in the Last Year: Never true    Ran Out of Food in the Last Year: Never true  Transportation Needs: No Transportation Needs (10/04/2021)   PRAPARE - Administrator, Civil Service (Medical): No    Lack of Transportation (Non-Medical): No  Physical Activity: Sufficiently Active (10/04/2021)   Exercise Vital Sign    Days of Exercise per Week: 5 days    Minutes of Exercise per Session: 30 min  Recent Concern: Physical Activity - Insufficiently Active (08/05/2021)   Exercise Vital Sign    Days of Exercise per Week: 2 days    Minutes of Exercise per Session: 50 min  Stress: No Stress Concern Present (10/04/2021)   [employer] of Occupational Health - Occupational Stress Questionnaire    Feeling of Stress : Not at all  Social Connections: Socially Integrated (10/04/2021)   Social Connection and Isolation Panel [NHANES]    Frequency of Communication with Friends and Family: More than three times a week    Frequency of Social Gatherings with Friends and  Family: Once a week    Attends Religious Services: More than 4 times per year    Active Member of Clubs or Organizations: Yes    Attends Banker Meetings: More than 4 times per year    Marital Status: Married  Catering manager Violence: Not At Risk (08/05/2021)   Humiliation, Afraid, Rape, and Kick questionnaire    Fear of Current or Ex-Partner: No    Emotionally Abused: No    Physically Abused: No    Sexually Abused: No    Family History  Problem Relation Age of Onset   Cancer Mother        breast   Stroke Mother    Diabetes Mother        type ll   Emphysema Father    Cancer Sister 30       lung cancer    Current Outpatient Medications on File Prior to Visit  Medication Sig Dispense Refill   gabapentin (NEURONTIN) 600 MG  tablet TAKE 1 AND 1/2 TABLETS BY MOUTH AT BEDTIME 135 tablet 0   lisinopril (ZESTRIL) 10 MG tablet Take 1 tablet (10 mg total) by mouth daily. 90 tablet 3   simvastatin (ZOCOR) 20 MG tablet TAKE 1 TABLET BY MOUTH EVERY DAY IN THE EVENING 90 tablet 3   traZODone (DESYREL) 50 MG tablet Take 50 mg by mouth at bedtime.     No current facility-administered medications on file prior to visit.    No Known Allergies     Physical Exam There were no vitals filed for this visit. Estimated body mass index is 26.5 kg/m as calculated from the following:   Height as of 10/08/21: 5\' 11"  (1.803 m).   Weight as of 08/04/22: 190 lb (86.2 kg).  EKG (optional): deferred due to virtual visit  GENERAL: alert, oriented, no acute distress detected; full vision exam deferred due to pandemic and/or virtual encounter   HEENT: atraumatic, conjunttiva clear, no obvious abnormalities on inspection of external nose and ears  NECK: normal movements of the head and neck  LUNGS: on inspection no signs of respiratory distress, breathing rate appears normal, no obvious gross SOB, gasping or wheezing  CV: no obvious cyanosis  MS: moves all visible extremities without noticeable abnormality  PSYCH/NEURO: pleasant and cooperative, no obvious depression or anxiety, speech and thought processing grossly intact, Cognitive function grossly intact  Flowsheet Row Office Visit from 08/04/2022 in [facility] [place]  PHQ-9 Total Score 1           08/04/2022    2:03 PM 08/05/2021   12:46 PM 08/01/2020   11:10 AM 04/24/2020    9:43 AM 12/28/2018    9:33 AM  Depression screen PHQ 2/9  Decreased Interest 0 0 0 0 0  Down, Depressed, Hopeless 0 0 0 0 0  PHQ - 2 Score 0 0 0 0 0  Altered sleeping 1   2   Tired, decreased energy 0   0   Change in appetite 0   0   Feeling bad or failure about yourself  0   0   Trouble concentrating 0   0   Moving slowly or fidgety/restless 0   0   Suicidal thoughts 0    0   PHQ-9 Score 1   2   Difficult doing work/chores Not difficult at all   Not difficult at all        08/01/2020   11:13 AM 08/05/2021   12:50  PM 10/04/2021   11:04 AM 08/04/2022    2:03 PM 08/06/2022    5:12 PM  Fall Risk  Falls in the past year? 0 0 0 0 0  Was there an injury with Fall? 0 0  0 0  Fall Risk Category Calculator 0 0  0 0  Fall Risk Category (Retired) Low Low     (RETIRED) Patient Fall Risk Level  Low fall risk     Patient at Risk for Falls Due to Impaired vision No Fall Risks  No Fall Risks   Fall risk Follow up Falls prevention discussed   Falls evaluation completed      SUMMARY AND PLAN:  Encounter for Medicare annual wellness exam  Discussed applicable health maintenance/preventive health measures and advised and referred or ordered per patient preferences: -he reports he had both doses of the shingles vaccine and agrees to obtain record and share with PCP office to update -discussed covid19 booster recs Health Maintenance  Topic Date Due   Zoster Vaccines- Shingrix (2 of 2) 07/17/2020   COVID-19 Vaccine (6 - 2023-24 season) 03/20/2022   INFLUENZA VACCINE  10/30/2022   Lung Cancer Screening  01/25/2023   Medicare Annual Wellness (AWV)  08/07/2023   DTaP/Tdap/Td (2 - Tdap) 01/06/2029   COLONOSCOPY (Pts 45-40yrs Insurance coverage will need to be confirmed)  09/23/2031   Pneumonia Vaccine 79+ Years old  Completed   Hepatitis C Screening  Completed   HPV VACCINES  Aged [employer] and counseling on the following was provided based on the above review of health and a plan/checklist for the patient, along with additional information discussed, was provided for the patient in the patient instructions :   Reviewed and demonstrated safe balance exercises that can be done at home to improve balance and discussed exercise guidelines for adults with include balance exercises at least 3 days per week.  -Advised and counseled on a healthy lifestyle - including the  importance of a healthy diet, regular physical activity, social connections and stress management. -Reviewed patient's current diet. Advised and counseled on a whole foods based healthy diet. A summary of a healthy diet was provided in the Patient Instructions.  -reviewed patient's current physical activity level and discussed exercise guidelines for adults. Discussed community resources and ideas for safe exercise at home to assist in meeting exercise guideline recommendations in a safe and healthy way.  -Advise yearly dental visits at minimum and regular eye exams -Advised and counseled on alcohol safe limits, risks  Follow up: see patient instructions   Patient Instructions  I really enjoyed getting to talk with you today! I am available on Tuesdays and Thursdays for virtual visits if you have any questions or concerns, or if I can be of any further assistance.   CHECKLIST FROM ANNUAL WELLNESS VISIT:  -Follow up (please call to schedule if not scheduled after visit):   -yearly for annual wellness visit with primary care office  Here is a list of your preventive care/health maintenance measures and the plan for each if any are due:  PLAN For any measures below that may be due:   Health Maintenance  Topic Date Due   Zoster Vaccines- Shingrix (2 of 2) 07/17/2020   COVID-19 Vaccine (6 - 2023-24 season) 03/20/2022   INFLUENZA VACCINE  10/30/2022   Lung Cancer Screening  01/25/2023   Medicare Annual Wellness (AWV)  08/07/2023   DTaP/Tdap/Td (2 - Tdap) 01/06/2029   COLONOSCOPY (Pts 45-104yrs Insurance coverage will  need to be confirmed)  09/23/2031   Pneumonia Vaccine 66+ Years old  Completed   Hepatitis C Screening  Completed   HPV VACCINES  Aged Out    -See a dentist at least yearly  -Get your eyes checked and then per your eye specialist's recommendations  -Other issues addressed today:   -I have included below further information regarding a healthy whole foods based diet,  physical activity guidelines for adults, stress management and opportunities for social connections. I hope you find this information useful.   -----------------------------------------------------------------------------------------------------------------------------------------------------------------------------------------------------------------------------------------------------------  NUTRITION: -eat real food: lots of colorful vegetables (half the plate) and fruits -5-7 servings of vegetables and fruits per day (fresh or steamed is best), exp. 2 servings of vegetables with lunch and dinner and 2 servings of fruit per day. Berries and greens such as kale and collards are great choices.  -consume on a regular basis: whole grains (make sure first ingredient on label contains the word "whole"), fresh fruits, fish, nuts, seeds, healthy oils (such as olive oil, avocado oil, grape seed oil) -may eat small amounts of dairy and lean meat on occasion, but avoid processed meats such as ham, bacon, lunch meat, etc. -drink water -try to avoid fast food and pre-packaged foods, processed meat -most experts advise limiting sodium to < 2300mg  per day, should limit further is any chronic conditions such as high blood pressure, heart disease, diabetes, etc. The American Heart Association advised that < 1500mg  is is ideal -try to avoid foods that contain any ingredients with names you do not recognize  -try to avoid sugar/sweets (except for the natural sugar that occurs in fresh fruit) -try to avoid sweet drinks -try to avoid white rice, white bread, pasta (unless whole grain), white or yellow potatoes  EXERCISE GUIDELINES FOR ADULTS: -if you wish to increase your physical activity, do so gradually and with the approval of your doctor -STOP and seek medical care immediately if you have any chest pain, chest discomfort or trouble breathing when starting or increasing exercise  -move and stretch your body,  legs, feet and arms when sitting for long periods -Physical activity guidelines for optimal health in adults: -least 150 minutes per week of aerobic exercise (can talk, but not sing) once approved by your doctor, 20-30 minutes of sustained activity or two 10 minute episodes of sustained activity every day.  -resistance training at least 2 days per week if approved by your doctor -balance exercises 3+ days per week:   Stand somewhere where you have something sturdy to hold onto if you lose balance.    1) lift up on toes, start with 5x per day and work up to 20x   2) stand and lift on leg straight out to the side so that foot is a few inches of the floor, start with 5x each side and work up to 20x each side   3) stand on one foot, start with 5 seconds each side and work up to 20 seconds on each side  If you need ideas or help with getting more active:  -Silver sneakers https://tools.silversneakers.com  -Walk with a Doc: http://www.duncan-williams.com/  -try to include resistance (weight lifting/strength building) and balance exercises twice per week: or the following link for ideas: http://castillo-powell.com/  BuyDucts.dk  STRESS MANAGEMENT: -can try meditating, or just sitting quietly with deep breathing while intentionally relaxing all parts of your body for 5 minutes daily -if you need further help with stress, anxiety or depression please follow up with your primary doctor  or contact the wonderful folks at [facility]: [phone]  SOCIAL CONNECTIONS: -options in [place] if you wish to engage in more social and exercise related activities:  -Silver sneakers https://tools.silversneakers.com  -Walk with a Doc: http://www.duncan-williams.com/  -Check out the [facility] Active Adults 50+ section on the [place] [employer] (hiking clubs, book clubs, cards and games, chess, exercise  classes, aquatic classes and much more) - see the website for details: https://www.[place].gov/departments/parks-recreation/active-adults50  -YouTube has lots of exercise videos for different ages and abilities as well  -[facility] (a variety of indoor and outdoor inperson activities for adults). [phone]. [address]  -Virtual Online Classes (a variety of topics): see seniorplanet.org or call [phone]  -consider volunteering at a school, hospice center, church, senior center or elsewhere           Terressa Koyanagi, DO

## 2022-08-12 ENCOUNTER — Ambulatory Visit (INDEPENDENT_AMBULATORY_CARE_PROVIDER_SITE_OTHER): Payer: Medicare PPO

## 2022-08-12 DIAGNOSIS — E538 Deficiency of other specified B group vitamins: Secondary | ICD-10-CM

## 2022-08-12 MED ORDER — CYANOCOBALAMIN 1000 MCG/ML IJ SOLN
1000.0000 ug | Freq: Once | INTRAMUSCULAR | Status: AC
  Administered 2022-08-12: 1000 ug via INTRAMUSCULAR

## 2022-08-12 NOTE — Progress Notes (Signed)
Per orders of Worthy Rancher, FNP, injection of Cyanocobalamin 1000 mcg given by Takhia Spoon L Garnette Greb. Patient tolerated injection well.

## 2022-08-14 ENCOUNTER — Ambulatory Visit
Admission: RE | Admit: 2022-08-14 | Discharge: 2022-08-14 | Disposition: A | Discharge status: Home / Self Care | Payer: Medicare PPO | Source: Ambulatory Visit | Attending: Family Medicine | Admitting: Family Medicine

## 2022-08-14 DIAGNOSIS — J3489 Other specified disorders of nose and nasal sinuses: Secondary | ICD-10-CM | POA: Diagnosis not present

## 2022-08-14 DIAGNOSIS — R6889 Other general symptoms and signs: Secondary | ICD-10-CM

## 2022-08-14 DIAGNOSIS — R41840 Attention and concentration deficit: Secondary | ICD-10-CM | POA: Diagnosis not present

## 2022-08-14 DIAGNOSIS — R55 Syncope and collapse: Secondary | ICD-10-CM | POA: Diagnosis not present

## 2022-08-14 MED ORDER — GADOPICLENOL 0.5 MMOL/ML IV SOLN
9.0000 mL | Freq: Once | INTRAVENOUS | Status: AC | PRN
  Administered 2022-08-14: 9 mL via INTRAVENOUS

## 2022-08-15 ENCOUNTER — Telehealth: Payer: Self-pay | Admitting: Family Medicine

## 2022-08-15 NOTE — Telephone Encounter (Signed)
MRI results were reviewed and patient verbalized understanding. Please see result note

## 2022-08-15 NOTE — Telephone Encounter (Signed)
Pt called, returning CMA's call. CMA was with a patient. Pt asked that CMA call back at his earliest convenience. 

## 2022-08-19 ENCOUNTER — Ambulatory Visit (INDEPENDENT_AMBULATORY_CARE_PROVIDER_SITE_OTHER): Payer: Medicare PPO

## 2022-08-19 DIAGNOSIS — E538 Deficiency of other specified B group vitamins: Secondary | ICD-10-CM

## 2022-08-19 MED ORDER — CYANOCOBALAMIN 1000 MCG/ML IJ SOLN
1000.0000 ug | Freq: Once | INTRAMUSCULAR | Status: AC
  Administered 2022-08-19: 1000 ug via INTRAMUSCULAR

## 2022-08-19 NOTE — Progress Notes (Signed)
Per orders of Dr. Hernandez, injection of Cyanocobalamin 1000 mcg given by Aanchal Cope L Neziah Vogelgesang. Patient tolerated injection well.  

## 2022-08-26 ENCOUNTER — Ambulatory Visit (INDEPENDENT_AMBULATORY_CARE_PROVIDER_SITE_OTHER): Payer: Medicare PPO

## 2022-08-26 DIAGNOSIS — E538 Deficiency of other specified B group vitamins: Secondary | ICD-10-CM

## 2022-08-26 MED ORDER — CYANOCOBALAMIN 1000 MCG/ML IJ SOLN
1000.0000 ug | Freq: Once | INTRAMUSCULAR | Status: AC
  Administered 2022-08-26: 1000 ug via INTRAMUSCULAR

## 2022-08-26 NOTE — Progress Notes (Signed)
Pt here for monthly B12 injection per Dr Caryl Never  B12 given IM and pt tolerated injection well.  Next B12 injection scheduled for 09/02/22

## 2022-09-02 ENCOUNTER — Ambulatory Visit (INDEPENDENT_AMBULATORY_CARE_PROVIDER_SITE_OTHER): Payer: Medicare PPO

## 2022-09-02 DIAGNOSIS — E538 Deficiency of other specified B group vitamins: Secondary | ICD-10-CM | POA: Diagnosis not present

## 2022-09-02 MED ORDER — CYANOCOBALAMIN 1000 MCG/ML IJ SOLN
1000.0000 ug | Freq: Once | INTRAMUSCULAR | Status: AC
  Administered 2022-09-02: 1000 ug via INTRAMUSCULAR

## 2022-09-02 NOTE — Progress Notes (Signed)
Pt here for weekly B12 injection per Dr. Caryl Never.  B12 given IM and pt tolerated injection well.  Next B12 injection scheduled for 09/09/22.

## 2022-09-09 ENCOUNTER — Ambulatory Visit (INDEPENDENT_AMBULATORY_CARE_PROVIDER_SITE_OTHER): Payer: Medicare PPO

## 2022-09-09 DIAGNOSIS — E538 Deficiency of other specified B group vitamins: Secondary | ICD-10-CM

## 2022-09-09 MED ORDER — CYANOCOBALAMIN 1000 MCG/ML IJ SOLN
1000.0000 ug | Freq: Once | INTRAMUSCULAR | Status: AC
  Administered 2022-09-09: 1000 ug via INTRAMUSCULAR

## 2022-09-09 NOTE — Progress Notes (Signed)
Per orders of Dr. Caryl Never, injection of Cyanocobalamin 1000 mcg given by Vickii Chafe on R deltoid.  Patient tolerated injection well.   Next B12 injection is schedule for September 16, 2022.

## 2022-09-16 ENCOUNTER — Ambulatory Visit (INDEPENDENT_AMBULATORY_CARE_PROVIDER_SITE_OTHER): Payer: Medicare PPO

## 2022-09-16 DIAGNOSIS — E538 Deficiency of other specified B group vitamins: Secondary | ICD-10-CM | POA: Diagnosis not present

## 2022-09-16 MED ORDER — CYANOCOBALAMIN 1000 MCG/ML IJ SOLN
1000.0000 ug | Freq: Once | INTRAMUSCULAR | Status: AC
  Administered 2022-09-16: 1000 ug via INTRAMUSCULAR

## 2022-09-16 NOTE — Progress Notes (Signed)
Per orders of Dr. Burchette , injection of Cyanocobalamin Inj. 1000 mcg given by Song Garris on Left Deltoid.  Patient tolerated injection well.    

## 2022-09-23 ENCOUNTER — Ambulatory Visit (INDEPENDENT_AMBULATORY_CARE_PROVIDER_SITE_OTHER): Payer: Medicare PPO | Admitting: *Deleted

## 2022-09-23 DIAGNOSIS — E538 Deficiency of other specified B group vitamins: Secondary | ICD-10-CM | POA: Diagnosis not present

## 2022-09-23 MED ORDER — CYANOCOBALAMIN 1000 MCG/ML IJ SOLN
1000.0000 ug | Freq: Once | INTRAMUSCULAR | Status: AC
  Administered 2022-09-23: 1000 ug via INTRAMUSCULAR

## 2022-09-23 NOTE — Progress Notes (Signed)
Per orders of Dr. Burchette, injection of B12 given by Jaleigh Mccroskey. Patient tolerated injection well. 

## 2022-09-30 ENCOUNTER — Ambulatory Visit (INDEPENDENT_AMBULATORY_CARE_PROVIDER_SITE_OTHER): Payer: Medicare PPO

## 2022-09-30 DIAGNOSIS — E538 Deficiency of other specified B group vitamins: Secondary | ICD-10-CM

## 2022-09-30 MED ORDER — CYANOCOBALAMIN 1000 MCG/ML IJ SOLN
1000.0000 ug | Freq: Once | INTRAMUSCULAR | Status: AC
  Administered 2022-09-30: 1000 ug via INTRAMUSCULAR

## 2022-09-30 NOTE — Progress Notes (Signed)
Pt here for monthly B12 injection per Dr Caryl Never.  B12 given IM and pt tolerated injection well.  Next B12 injection scheduled for 07/09.

## 2022-10-07 ENCOUNTER — Ambulatory Visit (INDEPENDENT_AMBULATORY_CARE_PROVIDER_SITE_OTHER): Payer: Medicare PPO

## 2022-10-07 DIAGNOSIS — E538 Deficiency of other specified B group vitamins: Secondary | ICD-10-CM | POA: Diagnosis not present

## 2022-10-07 MED ORDER — CYANOCOBALAMIN 1000 MCG/ML IJ SOLN
1000.0000 ug | Freq: Once | INTRAMUSCULAR | Status: AC
  Administered 2022-10-07: 1000 ug via INTRAMUSCULAR

## 2022-10-07 NOTE — Progress Notes (Signed)
Per orders of Kristian Covey, MD, injection of B12 given in Right  deltoid by Sherrin Daisy. Patient tolerated injection well.  Lab Results  Component Value Date   VITAMINB12 167 (L) 08/04/2022

## 2022-10-07 NOTE — Patient Instructions (Signed)
Health Maintenance Due  Topic Date Due   Zoster Vaccines- Shingrix (2 of 2) 07/17/2020   COVID-19 Vaccine (6 - 2023-24 season) 03/20/2022      Row Labels 08/04/2022    2:03 PM 08/05/2021   12:46 PM 08/01/2020   11:10 AM  Depression screen PHQ 2/9   Section Header. No data exists in this row.     Decreased Interest   0 0 0  Down, Depressed, Hopeless   0 0 0  PHQ - 2 Score   0 0 0  Altered sleeping   1    Tired, decreased energy   0    Change in appetite   0    Feeling bad or failure about yourself    0    Trouble concentrating   0    Moving slowly or fidgety/restless   0    Suicidal thoughts   0    PHQ-9 Score   1    Difficult doing work/chores   Not difficult at all

## 2022-10-12 ENCOUNTER — Other Ambulatory Visit: Payer: Self-pay | Admitting: Family Medicine

## 2022-10-14 ENCOUNTER — Ambulatory Visit: Payer: Medicare PPO

## 2022-10-14 DIAGNOSIS — E538 Deficiency of other specified B group vitamins: Secondary | ICD-10-CM | POA: Diagnosis not present

## 2022-10-14 MED ORDER — CYANOCOBALAMIN 1000 MCG/ML IJ SOLN
1000.0000 ug | Freq: Once | INTRAMUSCULAR | Status: AC
Start: 2022-10-14 — End: 2022-10-14
  Administered 2022-10-14: 1000 ug via INTRAMUSCULAR

## 2022-10-14 NOTE — Progress Notes (Signed)
 Per orders of Dr. Burchette, injection of Cyanocobalamin 1000 mcg given by Mykal L Good. Patient tolerated injection well.  

## 2022-10-21 ENCOUNTER — Ambulatory Visit (INDEPENDENT_AMBULATORY_CARE_PROVIDER_SITE_OTHER): Payer: Medicare PPO | Admitting: *Deleted

## 2022-10-21 DIAGNOSIS — E538 Deficiency of other specified B group vitamins: Secondary | ICD-10-CM | POA: Diagnosis not present

## 2022-10-21 MED ORDER — CYANOCOBALAMIN 1000 MCG/ML IJ SOLN
1000.0000 ug | Freq: Once | INTRAMUSCULAR | Status: AC
Start: 2022-10-21 — End: 2022-10-21
  Administered 2022-10-21: 1000 ug via INTRAMUSCULAR

## 2022-10-21 NOTE — Progress Notes (Signed)
Per orders of Dr. Burchette, injection of B12 given by Rachel Vereen. Patient tolerated injection well. 

## 2022-10-28 ENCOUNTER — Telehealth: Payer: Self-pay

## 2022-10-28 ENCOUNTER — Other Ambulatory Visit: Payer: Self-pay

## 2022-10-28 ENCOUNTER — Ambulatory Visit (INDEPENDENT_AMBULATORY_CARE_PROVIDER_SITE_OTHER): Payer: Medicare PPO

## 2022-10-28 DIAGNOSIS — E538 Deficiency of other specified B group vitamins: Secondary | ICD-10-CM

## 2022-10-28 MED ORDER — CYANOCOBALAMIN 1000 MCG/ML IJ SOLN
1000.0000 ug | Freq: Once | INTRAMUSCULAR | Status: AC
Start: 2022-10-28 — End: 2022-10-28
  Administered 2022-10-28: 1000 ug via INTRAMUSCULAR

## 2022-10-28 NOTE — Progress Notes (Signed)
Per orders of Dr. Burchette, injection of Cyanocobalamin 1000 mcg given by Mykal L Good. Patient tolerated injection well.  

## 2022-10-28 NOTE — Telephone Encounter (Signed)
Error

## 2022-10-29 ENCOUNTER — Other Ambulatory Visit: Payer: Self-pay | Admitting: Family Medicine

## 2022-11-03 ENCOUNTER — Telehealth: Payer: Self-pay | Admitting: Family Medicine

## 2022-11-03 NOTE — Telephone Encounter (Signed)
Patient informed of the message below and voiced understanding  

## 2022-11-03 NOTE — Telephone Encounter (Signed)
Pt is calling and would like to know if he is update on his pneumonia vaccines

## 2022-11-04 ENCOUNTER — Other Ambulatory Visit: Payer: Medicare PPO

## 2022-11-04 DIAGNOSIS — E538 Deficiency of other specified B group vitamins: Secondary | ICD-10-CM

## 2022-11-04 DIAGNOSIS — R944 Abnormal results of kidney function studies: Secondary | ICD-10-CM

## 2022-11-04 LAB — BASIC METABOLIC PANEL
BUN: 12 mg/dL (ref 6–23)
CO2: 28 mEq/L (ref 19–32)
Calcium: 10.2 mg/dL (ref 8.4–10.5)
Chloride: 97 mEq/L (ref 96–112)
Creatinine, Ser: 1.13 mg/dL (ref 0.40–1.50)
GFR: 63.52 mL/min (ref >60.00)
Glucose, Bld: 90 mg/dL (ref 70–99)
Potassium: 4.8 mEq/L (ref 3.5–5.1)
Sodium: 133 mEq/L — ABNORMAL LOW (ref 135–145)

## 2022-11-04 LAB — VITAMIN B12: Vitamin B-12: 885 pg/mL (ref 211–911)

## 2022-11-06 ENCOUNTER — Other Ambulatory Visit: Payer: Self-pay | Admitting: Family Medicine

## 2022-11-21 ENCOUNTER — Other Ambulatory Visit: Payer: Self-pay | Admitting: Family Medicine

## 2022-11-24 ENCOUNTER — Other Ambulatory Visit: Payer: Self-pay | Admitting: Family Medicine

## 2022-11-24 ENCOUNTER — Encounter: Payer: Self-pay | Admitting: Family Medicine

## 2022-11-26 ENCOUNTER — Encounter: Payer: Self-pay | Admitting: Family Medicine

## 2022-11-26 ENCOUNTER — Telehealth: Payer: Medicare PPO | Admitting: Family Medicine

## 2022-11-26 VITALS — Ht 71.0 in | Wt 190.0 lb

## 2022-11-26 DIAGNOSIS — E538 Deficiency of other specified B group vitamins: Secondary | ICD-10-CM

## 2022-11-26 DIAGNOSIS — R404 Transient alteration of awareness: Secondary | ICD-10-CM | POA: Diagnosis not present

## 2022-11-26 DIAGNOSIS — M792 Neuralgia and neuritis, unspecified: Secondary | ICD-10-CM | POA: Diagnosis not present

## 2022-11-26 MED ORDER — GABAPENTIN 600 MG PO TABS: ORAL_TABLET | ORAL | 3 refills | Status: DC

## 2022-11-26 NOTE — Progress Notes (Signed)
Patient ID: Adam Choi., male   DOB: 1945-05-09, 77 y.o.   MRN: [id number]   Virtual Visit via Video Note  I connected with Adam Choi on 11/26/22 at  5:00 PM EDT by a video enabled telemedicine application and verified that I am speaking with the correct person using two identifiers.  Location patient: home Location provider:work or home office Persons participating in the virtual visit: patient, provider  I discussed the limitations of evaluation and management by telemedicine and the availability of in person appointments. The patient expressed understanding and agreed to proceed.   HPI:  Adam Choi is here today to discuss low B12 level that was picked up on visit in May.  He had presented with 4 separate episodes of very transient (10 to 30-seconds) of staring off into space.  He had no recollection.  He had MRI scan which showed no acute findings.  Labs were significant for B12 167.  No prior history of B12 deficiency.  He had no episodes whatsoever since then.  No reported cognitive changes.  No paresthesias.  Denies any risk factors for B12 such as vegetarian status, chronic PPI use, history of gastric surgery, family history of pernicious anemia, etc.  Has received several B12 injections and had recent B12 level 885.  He has hypertension which has been controlled lisinopril 10 mg daily.  He takes simvastatin 20 mg daily for hyperlipidemia.  Also takes trazodone 50 mg at night.  He is on gabapentin 600 mg 1/2 tablets at night and needs refills.   ROS: See pertinent positives and negatives per HPI.  Past Medical History:  Diagnosis Date   Arthritis    Chicken pox    Colon polyp 2012   Fainting spell    GERD (gastroesophageal reflux disease)     Past Surgical History:  Procedure Laterality Date   FOOT SURGERY  04/01/2011   halux rigidus/bones spur   REPLACEMENT TOTAL HIP W/  RESURFACING IMPLANTS Right     Family History  Problem Relation Age of Onset    Cancer Mother        breast   Stroke Mother    Diabetes Mother        type ll   Emphysema Father    Cancer Sister 43       lung cancer    SOCIAL HX: Former smoker.  Quit 2010   Current Outpatient Medications:    lisinopril (ZESTRIL) 10 MG tablet, TAKE 1 TABLET BY MOUTH EVERY DAY, Disp: 90 tablet, Rfl: 0   simvastatin (ZOCOR) 20 MG tablet, TAKE 1 TABLET BY MOUTH EVERY DAY IN THE EVENING, Disp: 90 tablet, Rfl: 0   traZODone (DESYREL) 50 MG tablet, Take 50 mg by mouth at bedtime., Disp: , Rfl:    gabapentin (NEURONTIN) 600 MG tablet, TAKE 1 AND 1/2 TABLETS BY MOUTH AT BEDTIME, Disp: 135 tablet, Rfl: 3  EXAM:  VITALS per patient if applicable:  GENERAL: alert, oriented, appears well and in no acute distress  HEENT: atraumatic, conjunttiva clear, no obvious abnormalities on inspection of external nose and ears  NECK: normal movements of the head and neck  LUNGS: on inspection no signs of respiratory distress, breathing rate appears normal, no obvious gross SOB, gasping or wheezing  CV: no obvious cyanosis  MS: moves all visible extremities without noticeable abnormality  PSYCH/NEURO: pleasant and cooperative, no obvious depression or anxiety, speech and thought processing grossly intact  ASSESSMENT AND PLAN:  Discussed the following assessment and plan:  #1  B12 deficiency.  Presumably related to poor absorption.  He has no dietary risk factors and no chronic PPI use.  B12 now replete.  We did discuss possible oral replacement with 500 mcg daily and recheck B12 level in about 6 months to make sure this is maintaining adequate levels.  #2 history of recent transient decreased awareness.  Do not think B12 deficiency is related to episodes they came in for evaluation of in May.  He has not had any episodes since then.  If these recur consider neurology referral for evaluation of etiology such as absence seizure  #3 history of neuropathic pain treated with gabapentin.  Currently  doing well with 600 mg gabapentin at night.  Refills provided.   I discussed the assessment and treatment plan with the patient. The patient was provided an opportunity to ask questions and all were answered. The patient agreed with the plan and demonstrated an understanding of the instructions.   The patient was advised to call back or seek an in-person evaluation if the symptoms worsen or if the condition fails to improve as anticipated.     Evelena Peat, MD

## 2022-11-28 ENCOUNTER — Ambulatory Visit: Payer: Medicare PPO

## 2023-01-02 ENCOUNTER — Ambulatory Visit (INDEPENDENT_AMBULATORY_CARE_PROVIDER_SITE_OTHER): Payer: Medicare PPO

## 2023-01-02 DIAGNOSIS — Z23 Encounter for immunization: Secondary | ICD-10-CM

## 2023-01-26 ENCOUNTER — Ambulatory Visit (HOSPITAL_BASED_OUTPATIENT_CLINIC_OR_DEPARTMENT_OTHER)
Admission: RE | Admit: 2023-01-26 | Discharge: 2023-01-26 | Disposition: A | Discharge status: Home / Self Care | Payer: Medicare PPO | Source: Ambulatory Visit | Attending: Family Medicine | Admitting: Family Medicine

## 2023-01-26 DIAGNOSIS — Z87891 Personal history of nicotine dependence: Secondary | ICD-10-CM | POA: Insufficient documentation

## 2023-01-26 DIAGNOSIS — Z122 Encounter for screening for malignant neoplasm of respiratory organs: Secondary | ICD-10-CM | POA: Diagnosis not present

## 2023-02-16 ENCOUNTER — Other Ambulatory Visit: Payer: Self-pay | Admitting: Acute Care

## 2023-02-16 DIAGNOSIS — Z122 Encounter for screening for malignant neoplasm of respiratory organs: Secondary | ICD-10-CM

## 2023-02-16 DIAGNOSIS — Z87891 Personal history of nicotine dependence: Secondary | ICD-10-CM

## 2023-02-20 ENCOUNTER — Other Ambulatory Visit: Payer: Self-pay | Admitting: Family Medicine

## 2023-02-21 ENCOUNTER — Other Ambulatory Visit: Payer: Self-pay | Admitting: Family Medicine

## 2023-03-19 ENCOUNTER — Other Ambulatory Visit: Payer: Self-pay | Admitting: Family Medicine

## 2023-04-21 ENCOUNTER — Other Ambulatory Visit: Payer: Self-pay | Admitting: Family Medicine

## 2023-04-24 ENCOUNTER — Encounter: Payer: Self-pay | Admitting: Family Medicine

## 2023-04-27 ENCOUNTER — Encounter: Payer: Self-pay | Admitting: Family Medicine

## 2023-04-27 ENCOUNTER — Ambulatory Visit: Payer: Medicare PPO | Admitting: Family Medicine

## 2023-04-27 VITALS — BP 120/64 | HR 78 | Temp 98.1°F | Wt 193.3 lb

## 2023-04-27 DIAGNOSIS — M545 Low back pain, unspecified: Secondary | ICD-10-CM | POA: Diagnosis not present

## 2023-04-27 DIAGNOSIS — I10 Essential (primary) hypertension: Secondary | ICD-10-CM | POA: Diagnosis not present

## 2023-04-27 DIAGNOSIS — G8929 Other chronic pain: Secondary | ICD-10-CM

## 2023-04-27 DIAGNOSIS — H25813 Combined forms of age-related cataract, bilateral: Secondary | ICD-10-CM | POA: Diagnosis not present

## 2023-04-27 DIAGNOSIS — E538 Deficiency of other specified B group vitamins: Secondary | ICD-10-CM | POA: Diagnosis not present

## 2023-04-27 DIAGNOSIS — H35373 Puckering of macula, bilateral: Secondary | ICD-10-CM | POA: Diagnosis not present

## 2023-04-27 DIAGNOSIS — H35033 Hypertensive retinopathy, bilateral: Secondary | ICD-10-CM | POA: Diagnosis not present

## 2023-04-27 DIAGNOSIS — E785 Hyperlipidemia, unspecified: Secondary | ICD-10-CM | POA: Diagnosis not present

## 2023-04-27 DIAGNOSIS — D3131 Benign neoplasm of right choroid: Secondary | ICD-10-CM | POA: Diagnosis not present

## 2023-04-27 DIAGNOSIS — H524 Presbyopia: Secondary | ICD-10-CM | POA: Diagnosis not present

## 2023-04-27 DIAGNOSIS — G4700 Insomnia, unspecified: Secondary | ICD-10-CM

## 2023-04-27 MED ORDER — DOXEPIN HCL 10 MG PO CAPS: 10.0000 mg | ORAL_CAPSULE | Freq: Every evening | ORAL | 1 refills | Status: DC | PRN

## 2023-04-27 MED ORDER — SIMVASTATIN 20 MG PO TABS: ORAL_TABLET | ORAL | 3 refills | Status: DC

## 2023-04-27 MED ORDER — GABAPENTIN 300 MG PO CAPS: ORAL_CAPSULE | ORAL | 1 refills | Status: DC

## 2023-04-27 NOTE — Progress Notes (Signed)
Established Patient Office Visit  Subjective   Patient ID: Adam Drinkard., male    DOB: 01/31/1946  Age: 78 y.o. MRN: [id number]  No chief complaint on file.   HPI   Adam Choi is seen for medical follow-up.  He has history of hypertension which is treated with lisinopril 10 mg daily.  Hyperlipidemia treated with simvastatin 20 mg daily.  Has history of some chronic back pain and currently on gabapentin 600 mg at night.  Because of size of tablet he would like to reduce this down to 300 mg and perhaps try scaling back to 1 tablet at night.  Needs follow-up lipid panel.  Remains quite active with exercising regularly.  No recent chest pains.  No recent falls.  Does have some chronic insomnia.  Has been on trazodone 50 mg at night but feels like this is not really helping any.  Does sometimes drink up to 2 alcoholic beverages at night but not consistently.  He has history of low B12 and after intramuscular replacement switched over last spring to oral B12.  No follow-up level since then.  Past Medical History:  Diagnosis Date   Arthritis    Chicken pox    Colon polyp 2012   Fainting spell    GERD (gastroesophageal reflux disease)    Past Surgical History:  Procedure Laterality Date   FOOT SURGERY  04/01/2011   halux rigidus/bones spur   REPLACEMENT TOTAL HIP W/  RESURFACING IMPLANTS Right     reports that he quit smoking about 15 years ago. His smoking use included cigarettes. He started smoking about 45 years ago. He has a 30 pack-year smoking history. He has never used smokeless tobacco. He reports current alcohol use of about 10.0 standard drinks of alcohol per week. He reports that he does not use drugs. family history includes Cancer in his mother; Cancer (age of onset: 52) in his sister; Diabetes in his mother; Emphysema in his father; Stroke in his mother. No Known Allergies  Review of Systems  Constitutional:  Negative for malaise/fatigue.  Eyes:  Negative for blurred  vision.  Respiratory:  Negative for shortness of breath.   Cardiovascular:  Negative for chest pain.  Neurological:  Negative for dizziness, weakness and headaches.  Psychiatric/Behavioral:  The patient has insomnia.       Objective:     BP 120/64 (BP Location: Left Arm, Patient Position: Sitting, Cuff Size: Normal)   Pulse 78   Temp 98.1 F (36.7 C) (Oral)   Wt 193 lb 4.8 oz (87.7 kg)   SpO2 99%   BMI 26.96 kg/m  BP Readings from Last 3 Encounters:  04/27/23 120/64  08/04/22 128/76  10/08/21 106/60   Wt Readings from Last 3 Encounters:  04/27/23 193 lb 4.8 oz (87.7 kg)  11/26/22 190 lb (86.2 kg)  08/04/22 190 lb (86.2 kg)      Physical Exam Vitals reviewed.  Constitutional:      Appearance: He is well-developed.  HENT:     Right Ear: External ear normal.     Left Ear: External ear normal.  Eyes:     Pupils: Pupils are equal, round, and reactive to light.  Neck:     Thyroid: No thyromegaly.  Cardiovascular:     Rate and Rhythm: Normal rate and regular rhythm.  Pulmonary:     Effort: Pulmonary effort is normal. No respiratory distress.     Breath sounds: Normal breath sounds. No wheezing or rales.  Musculoskeletal:  Cervical back: Neck supple.     Right lower leg: No edema.     Left lower leg: No edema.  Neurological:     General: No focal deficit present.     Mental Status: He is alert and oriented to person, place, and time.     Cranial Nerves: No cranial nerve deficit.      No results found for any visits on 04/27/23.  Last CBC Lab Results  Component Value Date   WBC 6.8 08/04/2022   HGB 15.3 08/04/2022   HCT 45.4 08/04/2022   MCV 92.2 08/04/2022   RDW 13.5 08/04/2022   PLT 219.0 08/04/2022   Last metabolic panel Lab Results  Component Value Date   GLUCOSE 90 11/04/2022   NA 133 (L) 11/04/2022   K 4.8 11/04/2022   CL 97 11/04/2022   CO2 28 11/04/2022   BUN 12 11/04/2022   CREATININE 1.13 11/04/2022   GFR 63.52 11/04/2022   CALCIUM  10.2 11/04/2022   PROT 7.2 10/09/2021   ALBUMIN 4.4 10/09/2021   BILITOT 0.7 10/09/2021   ALKPHOS 68 10/09/2021   AST 17 10/09/2021   ALT 12 10/09/2021   Last lipids Lab Results  Component Value Date   CHOL 197 10/09/2021   HDL 75.10 10/09/2021   LDLCALC 104 (H) 10/09/2021   TRIG 88.0 10/09/2021   CHOLHDL 3 10/09/2021   Last thyroid functions Lab Results  Component Value Date   TSH 0.89 08/04/2022   Last vitamin B12 and Folate Lab Results  Component Value Date   VITAMINB12 885 11/04/2022    The 10-year ASCVD risk score (Arnett DK, et al., 2019) is: 24.2%    Assessment & Plan:   #1 hypertension stable and well-controlled on lisinopril 10 mg daily.  Check basic metabolic panel.  Continue lisinopril 10 mg daily  #2 hyperlipidemia treated with simvastatin 20 mg daily.  Check lipid and hepatic panel.  #3 chronic back pain.  Currently stable on gabapentin.  We wrote prescription for gabapentin 300 mg 1-2 nightly.  He hopes to try to taper down to 300 mg at night  #4 insomnia.  Not responding to trazodone.  Handout on sleep hygiene given.  Try to reduce alcohol use at night.  Consider short-term trial of doxepin 10 mg p.o. nightly.  Reviewed potential side effects.  #5 B12 deficiency.  Recheck B12 level.  Patient now on oral over-the-counter replacement   No follow-ups on file.    Evelena Peat, MD

## 2023-04-28 ENCOUNTER — Encounter: Payer: Self-pay | Admitting: Family Medicine

## 2023-04-28 LAB — LIPID PANEL
Cholesterol: 158 mg/dL (ref 0–200)
HDL: 69.9 mg/dL (ref >39.00)
LDL Cholesterol: 53 mg/dL (ref 0–99)
NonHDL: 87.99
Total CHOL/HDL Ratio: 2
Triglycerides: 176 mg/dL — ABNORMAL HIGH (ref 0.0–149.0)
VLDL: 35.2 mg/dL (ref 0.0–40.0)

## 2023-04-28 LAB — VITAMIN B12: Vitamin B-12: 850 pg/mL (ref 211–911)

## 2023-04-28 LAB — HEPATIC FUNCTION PANEL
ALT: 12 U/L (ref 0–53)
AST: 15 U/L (ref 0–37)
Albumin: 4.4 g/dL (ref 3.5–5.2)
Alkaline Phosphatase: 59 U/L (ref 39–117)
Bilirubin, Direct: 0.1 mg/dL (ref 0.0–0.3)
Total Bilirubin: 0.8 mg/dL (ref 0.2–1.2)
Total Protein: 6.9 g/dL (ref 6.0–8.3)

## 2023-04-28 LAB — BASIC METABOLIC PANEL
BUN: 13 mg/dL (ref 6–23)
CO2: 32 meq/L (ref 19–32)
Calcium: 10 mg/dL (ref 8.4–10.5)
Chloride: 98 meq/L (ref 96–112)
Creatinine, Ser: 1.24 mg/dL (ref 0.40–1.50)
GFR: 56.63 mL/min — ABNORMAL LOW (ref >60.00)
Glucose, Bld: 105 mg/dL — ABNORMAL HIGH (ref 70–99)
Potassium: 4.7 meq/L (ref 3.5–5.1)
Sodium: 135 meq/L (ref 135–145)

## 2023-05-22 ENCOUNTER — Other Ambulatory Visit: Payer: Self-pay | Admitting: Family Medicine

## 2023-05-28 ENCOUNTER — Ambulatory Visit: Payer: Self-pay | Admitting: Family Medicine

## 2023-05-28 DIAGNOSIS — J00 Acute nasopharyngitis [common cold]: Secondary | ICD-10-CM | POA: Diagnosis not present

## 2023-05-28 NOTE — Telephone Encounter (Signed)
 Chief Complaint: Information reported   Disposition: [] ED /[] Urgent Care (no appt availability in office) / [] Appointment(In office/virtual)/ []  [facility]/ [x] Home Care/ [] Refused Recommended Disposition /[] [facility]/ []  Follow-up with PCP Additional Notes: Patient stated he was already at [facility] and no longer needed to be triaged since he is waiting to see a provider now. Patient stated he just wanted to get checked out before his 0800 flight in the morning.  Copied From CRM [id number]. Reason for Triage:  Sore throat and ear pain since this morning. Is taking a flight tomorrow morning and is worried about how the pressure will effect his ears. Would like advise on where he should seek care due to no avail of appts today.   Reason for Disposition  Caller has already spoken with the PCP and has no further questions.  Answer Assessment - Initial Assessment Questions 1. REASON FOR CALL or QUESTION: "What is your reason for calling today?" or "How can I best help you?" or "What question do you have that I can help answer?"     Wanted to know if he should go to urgent care.  Protocols used: Information Only Call - No Triage-A-AH, No Contact or Duplicate Contact Call-A-AH

## 2023-05-29 ENCOUNTER — Ambulatory Visit: Payer: Medicare PPO | Admitting: Family Medicine

## 2023-05-29 ENCOUNTER — Encounter: Payer: Self-pay | Admitting: Family Medicine

## 2023-05-29 VITALS — BP 126/80 | HR 68 | Temp 98.0°F | Ht 71.0 in | Wt 194.0 lb

## 2023-05-29 DIAGNOSIS — J3489 Other specified disorders of nose and nasal sinuses: Secondary | ICD-10-CM | POA: Diagnosis not present

## 2023-05-29 DIAGNOSIS — G500 Trigeminal neuralgia: Secondary | ICD-10-CM

## 2023-05-29 DIAGNOSIS — H6123 Impacted cerumen, bilateral: Secondary | ICD-10-CM

## 2023-05-29 DIAGNOSIS — R519 Headache, unspecified: Secondary | ICD-10-CM | POA: Diagnosis not present

## 2023-05-29 LAB — POCT INFLUENZA A/B
Influenza A, POC: NEGATIVE
Influenza B, POC: NEGATIVE

## 2023-05-29 MED ORDER — PREDNISONE 10 MG PO TABS: ORAL_TABLET | ORAL | 0 refills | Status: AC

## 2023-05-29 NOTE — Patient Instructions (Addendum)
-  Negative for influenza.  -Ear lavage in both ears completed. Unsuccessful, not completely cleaned. Recommend to try Debrox at home. If no improved, follow up for a visit to clean ears.  -Symptoms correlate with trigeminal neuralgia, especially since you have a history of this. Provided written material about trigeminal neuralgia. -Prescribed Prednisone 10mg , 6 day taper to help with these symptoms.  -Discussed about monitoring for a rash that could appear with shingles that have similar symptoms of nerve pain.  -If a rash does appear or your symptoms do not improve, follow up.  -Also, you may take an additional Gabapentin (300mg ) tablet in the morning for the nerve pain that you are already prescribed at night time.  -Take care and I hope you get to feeling better soon.

## 2023-05-29 NOTE — Progress Notes (Signed)
 Acute Office Visit   Subjective:  Patient ID: Adam Fredericksen., male    DOB: 1945/10/03, 78 y.o.   MRN: [id number]  Chief Complaint  Patient presents with   Headache    Pt c/o shooting headache  on Right side. Pt states he had "20 strikes in 10 minutes".  Had a little bit of runny nose. Had shooting pain on R ear, went to [facility] yesterday. Was given no prescription. Pt reports he didn't have headache yesterday. Headache started last night. Pt reports tested for covid yesterday and it was negative. Denied bodyache, fatigue. Denied chest pain, fever. Pt reports had a bit of sore throat two days ago. Sore throat is resolved now.     HPI:  Patient is complaining of headache, right ear pain, and nasal drainage. He reports he had a sore throat that started on Tuesday or Wednesday, but resolved. On Wednesday, he developed right ear pain, but it has resolved also. Pain was described as a sharp, shooting pain, intermittent. He did have some radiating pain from the ear to his head. Last night, he reports his headache pain changed. He reports it is on the right side. Described as a "shooting" pain, like "lightening strikes, 20 strikes in 10 minutes". Also, reports this morning when lying in bed as he was experiencing the shooting pain, he felt like an electrical jolt in his chest. Denies any CP or SHOB. Denies dizziness, lightheadedness, or blurry vision. He reports he has took Extra Strength Tylenol with no relief.   He was seen yesterday at [facility] for right ear pain, sore throat, and headache. He had a negative covid test, but did not test for influenza. He was diagnosed with acute nasopharyngitis. Recommend to use OTC cold medication, Flonase or Afrin, increase fluids, rest, and use cool mist humidifier at night.   He reports he was previously diagnosed with trigeminal neuralgia about 20 years ago. He reports he thinks he took Depakote for a  very brief period and then resolved. He reports his symptoms he is feeling today are similar to then.  Review of Systems  Neurological:  Positive for headaches.  See HPI above     Objective:   BP 126/80 (BP Location: Left Arm, Patient Position: Sitting, Cuff Size: Large)   Pulse 68   Temp 98 F (36.7 C) (Oral)   Ht 5\' 11"  (1.803 m)   Wt 194 lb (88 kg)   SpO2 97%   BMI 27.06 kg/m    Physical Exam Vitals reviewed.  Constitutional:      General: He is not in acute distress.    Appearance: Normal appearance. He is not ill-appearing, toxic-appearing or diaphoretic.  HENT:     Head: Normocephalic and atraumatic.     Right Ear: There is impacted cerumen.     Left Ear: There is impacted cerumen.  Eyes:     General:        Right eye: No discharge.        Left eye: No discharge.     Extraocular Movements: Extraocular movements intact.     Right eye: No nystagmus.     Left eye: No nystagmus.     Conjunctiva/sclera: Conjunctivae normal.     Pupils: Pupils are equal, round, and reactive to light.  Cardiovascular:     Rate and Rhythm: Normal rate and regular rhythm.     Heart sounds: Normal heart sounds. No murmur heard.  No friction rub. No gallop.  Pulmonary:     Effort: Pulmonary effort is normal. No respiratory distress.     Breath sounds: Normal breath sounds.  Musculoskeletal:        General: Normal range of motion.  Skin:    General: Skin is warm and dry.  Neurological:     General: No focal deficit present.     Mental Status: He is alert and oriented to person, place, and time. Mental status is at baseline.     Cranial Nerves: Cranial nerves 2-12 are intact. No facial asymmetry.     Sensory: Sensory deficit present.     Motor: No weakness.     Comments: Normal sensation in face.  However, right side of his head going from the ear to the vertex, sensation is different.   Psychiatric:        Mood and Affect: Mood normal.        Speech: Speech normal.        Behavior:  Behavior normal.        Thought Content: Thought content normal.        Judgment: Judgment normal.   PRE-PROCEDURE EXAM: Left and Right TM cannot be visualized due to total occlusion/impaction of the ear canal. PROCEDURE INDICATION: Remove wax to visualize ear drum & relieve discomfort CONSENT:  Verbalto  PROCEDURE NOTE: Left and Right ear: The CMA, Karpuih Moyun, irrigated both ears with warm water and ear drops to remove the wax.   POST- PROCEDURE EXAM: Unsuccessful, still unable to visualize TM.     Assessment & Plan:  Bilateral impacted cerumen -     Ear Lavage  Trigeminal neuralgia -     predniSONE; Take 6 tablets (60 mg total) by mouth daily with breakfast for 1 day, THEN 5 tablets (50 mg total) daily with breakfast for 1 day, THEN 4 tablets (40 mg total) daily with breakfast for 1 day, THEN 3 tablets (30 mg total) daily with breakfast for 1 day, THEN 2 tablets (20 mg total) daily with breakfast for 1 day, THEN 1 tablet (10 mg total) daily with breakfast for 1 day.  Dispense: 21 tablet; Refill: 0  Acute nonintractable headache, unspecified headache type -     POCT Influenza A/B  Nasal drainage -     POCT Influenza A/B  -Negative for influenza due to complaints of headache and nasal drainage.  -Bilateral ear lavage completed due to impaction. Unsuccessful, not completely cleaned. Recommend to try Debrox at home. If no improved, follow up for a visit to clean ears.  -Symptoms correlate with trigeminal neuralgia, especially since he has had a history of this. -Provided written material about trigeminal neuralgia. -Prescribed Prednisone 10mg , 6 day taper to help with these symptoms.  -Discussed about monitoring for a rash that could appear with shingles that have similar symptoms with nerve pain.  -If a rash does appear or his symptoms do not improve, follow up.  -Also, advised he may take an additional Gabapentin (300mg ) tablet in the morning for the nerve pain that he is already  prescribed at night time.   Zandra Abts, NP

## 2023-05-31 ENCOUNTER — Other Ambulatory Visit: Payer: Self-pay | Admitting: Family Medicine

## 2023-06-16 DIAGNOSIS — L570 Actinic keratosis: Secondary | ICD-10-CM | POA: Diagnosis not present

## 2023-06-16 DIAGNOSIS — L820 Inflamed seborrheic keratosis: Secondary | ICD-10-CM | POA: Diagnosis not present

## 2023-06-16 DIAGNOSIS — D692 Other nonthrombocytopenic purpura: Secondary | ICD-10-CM | POA: Diagnosis not present

## 2023-06-16 DIAGNOSIS — L821 Other seborrheic keratosis: Secondary | ICD-10-CM | POA: Diagnosis not present

## 2023-06-16 DIAGNOSIS — D1801 Hemangioma of skin and subcutaneous tissue: Secondary | ICD-10-CM | POA: Diagnosis not present

## 2023-06-16 DIAGNOSIS — L814 Other melanin hyperpigmentation: Secondary | ICD-10-CM | POA: Diagnosis not present

## 2023-06-25 IMAGING — CT CT CHEST LUNG CANCER SCREENING LOW DOSE W/O CM
1 series · 10 of 10 positions shown, 13 images · non-contrast
Comparison: 01/19/2020.

CLINICAL DATA: Former smoker, quit in 1222, 44 pack-year history.

EXAM:
CT CHEST WITHOUT CONTRAST LOW-DOSE FOR LUNG CANCER SCREENING
TECHNIQUE: Multidetector CT imaging of the chest was performed following the
standard protocol without IV contrast.

[ct lung segmentation data · axial · 0.78mm/px · z∈[-358,-358]mm · 10 of 356 frames shown]
[frame 1/356  mediastinal]
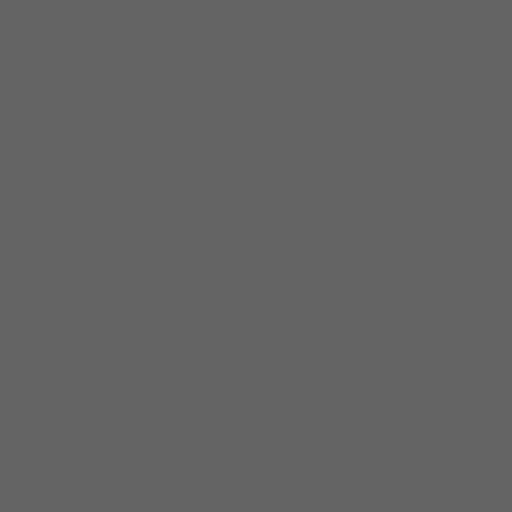
[frame 1/356  lung]
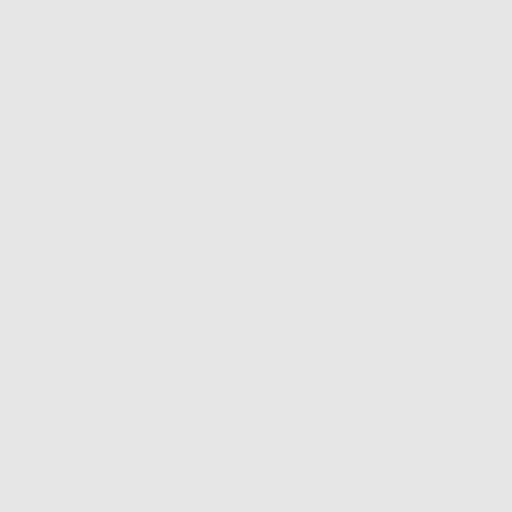
[frame 40/356  lung]
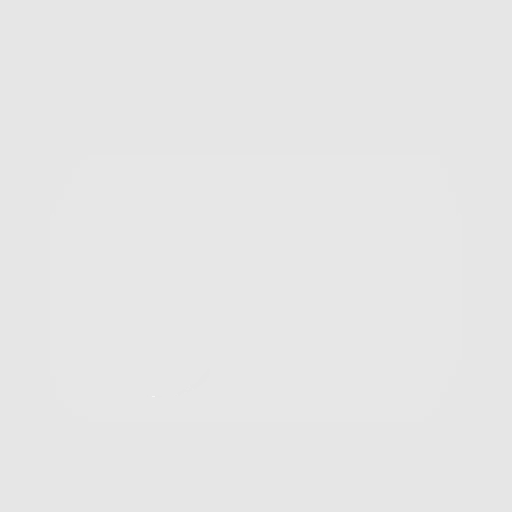
[frame 79/356  lung]
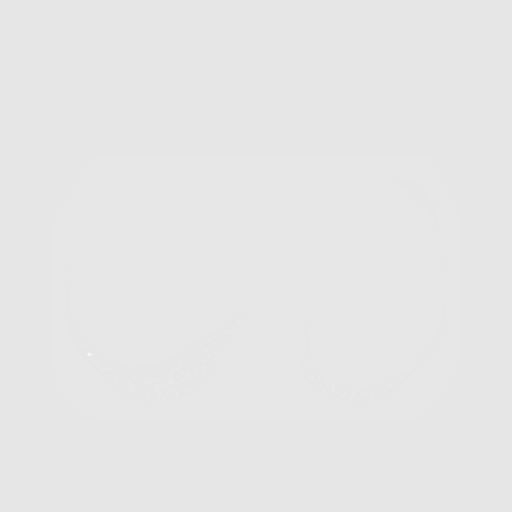
[frame 119/356  lung]
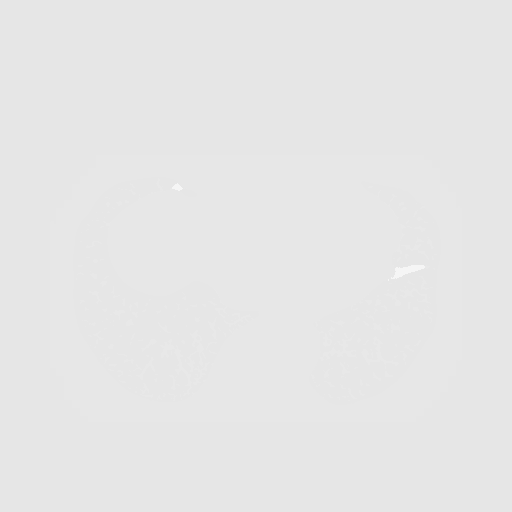
[frame 158/356  mediastinal]
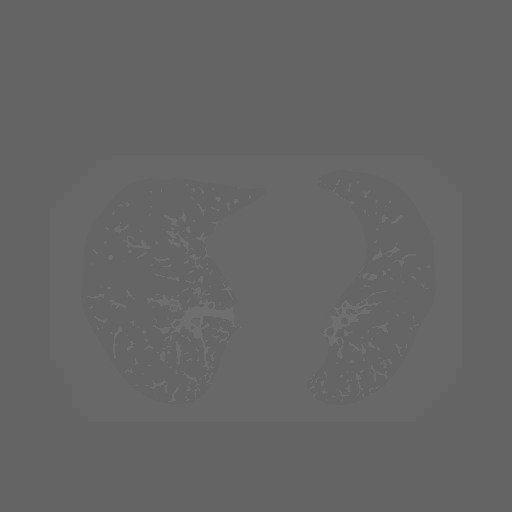
[frame 158/356  lung]
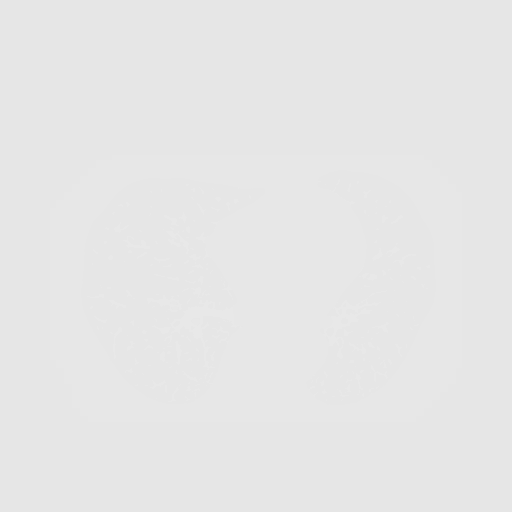
[frame 198/356  lung]
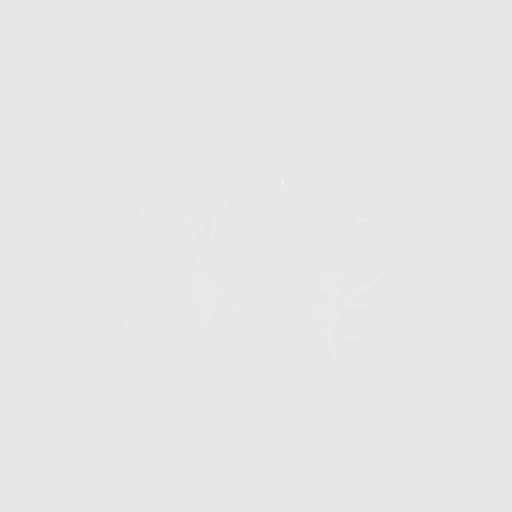
[frame 237/356  lung]
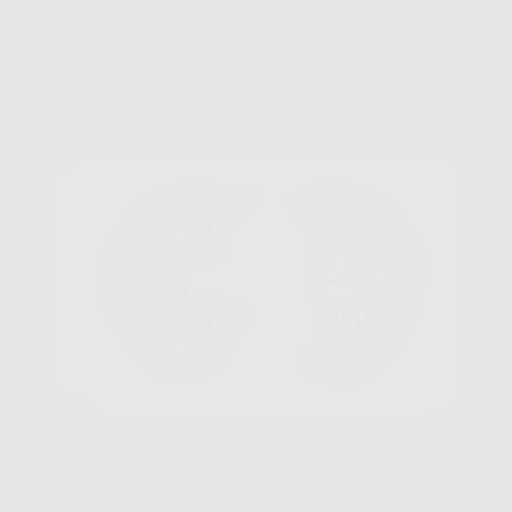
[frame 277/356  lung]
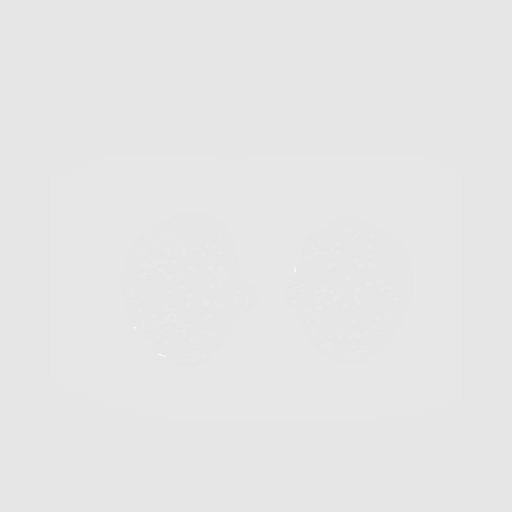
[frame 316/356  mediastinal]
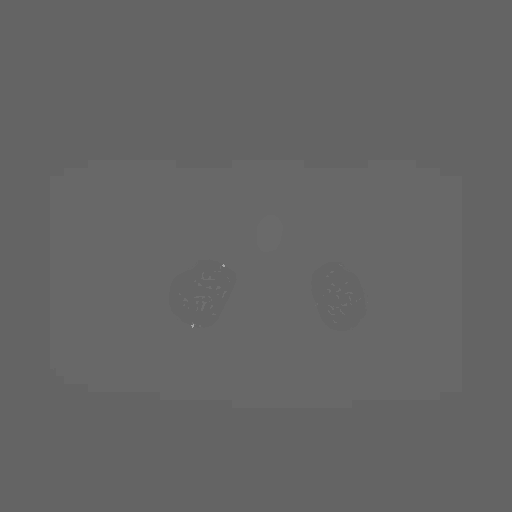
[frame 316/356  lung]
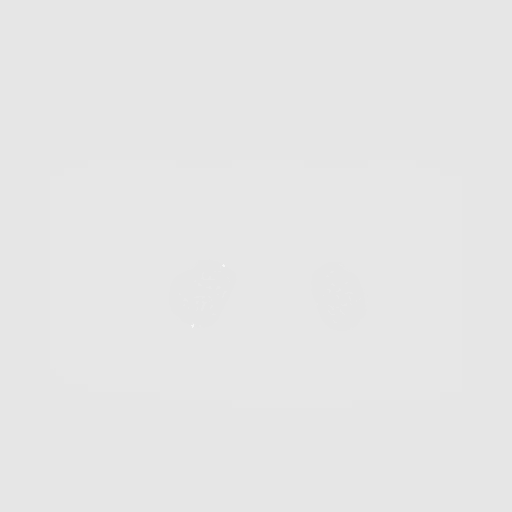
[frame 356/356  lung]
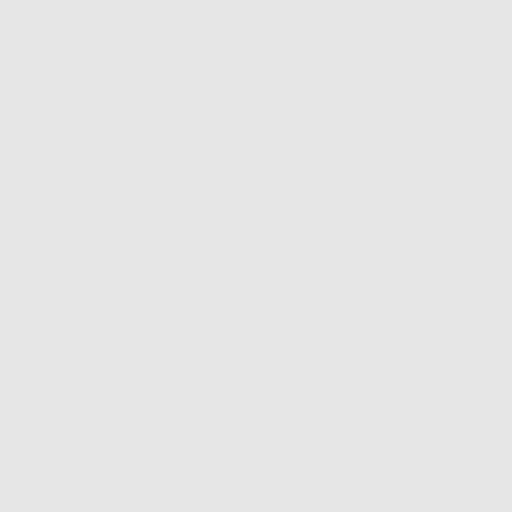

[10 of 10 positions shown; findings below may reference images not displayed]

FINDINGS: Cardiovascular: Atherosclerotic calcification of the aorta, aortic
valve and coronary arteries. Heart is at the upper limits of normal
in size. No pericardial effusion.

Mediastinum/Nodes: No pathologically enlarged mediastinal or
axillary lymph nodes. Hilar regions are difficult to definitively
evaluate without IV contrast but appear grossly unremarkable.
Esophagus is grossly unremarkable.

Lungs/Pleura: Centrilobular emphysema. Mild basilar pulmonary
parenchymal scarring. Mild central bronchiectasis. Calcified
granulomas. No new or suspicious pulmonary nodules. No pleural
fluid. Airway is unremarkable.

Upper Abdomen: 1.3 cm low-attenuation lesion in segment 4 of the
liver is unchanged and likely a cyst. Visualized portions of the
liver, gallbladder, adrenal glands, kidneys, spleen, pancreas,
stomach and bowel are otherwise unremarkable. No upper abdominal
adenopathy.

Musculoskeletal: Degenerative changes in the spine. There may be a
mild pectus deformity. No worrisome lytic or sclerotic lesions.
IMPRESSION: 1. Lung-RADS 1, negative. Continue annual screening with low-dose
chest CT without contrast in 12 months.
2. Mild central bronchiectasis.
3. Aortic atherosclerosis (9J5CV-JSZ.Z). Coronary artery
calcification.
4.  Emphysema ([id number]-ZSL.8).

## 2023-08-30 ENCOUNTER — Other Ambulatory Visit: Payer: Self-pay | Admitting: Family Medicine

## 2023-10-06 ENCOUNTER — Ambulatory Visit (INDEPENDENT_AMBULATORY_CARE_PROVIDER_SITE_OTHER): Admitting: Family Medicine

## 2023-10-06 DIAGNOSIS — Z0000 Encounter for general adult medical examination without abnormal findings: Secondary | ICD-10-CM | POA: Diagnosis not present

## 2023-10-06 NOTE — Progress Notes (Signed)
 PATIENT CHECK-IN and HEALTH RISK ASSESSMENT QUESTIONNAIRE:  -completed by phone/video for upcoming Medicare Preventive Visit  Pre-Visit Check-in: 1)Vitals (height, wt, BP, etc) - record in vitals section for visit on day of visit Request home vitals (wt, BP, etc.) and enter into vitals, THEN update Vital Signs SmartPhrase below at the top of the HPI. See below.  2)Review and Update Medications, Allergies PMH, Surgeries, Social history in Epic 3)Hospitalizations in the last year with date/reason? n  4)Review and Update Care Team (patient's specialists) in Epic 5) Complete PHQ9 in Epic  6) Complete Fall Screening in Epic 7)Review all Health Maintenance Due and order under PCP if not done.  Medicare Wellness Patient Questionnaire:  Answer theses question about your habits: How often do you have a drink containing alcohol? 4 times per week How many drinks containing alcohol do you have on a typical day when you are drinking?  1-2 drinks How often do you have six or more drinks on one occasion?never Have you ever smoked?y Quit date if applicable? 2010  How many packs a day do/did you smoke? 3/4 ppd Do you use smokeless tobacco?n Do you use an illicit drugs?n On average, how many days per week do you engage in moderate to strenuous exercise (like a brisk walk)?4 days a week On average, how many minutes do you engage in exercise at this level? 40 minutes, walks 3-4 miles, also goes [facility] Typical diet: feels eats pretty well, decreased sugar recently, oatmeal, veggies, meats, does eat out some but chooses healthier options salad and salmon, he enjoys cooking  Answer theses question about your everyday activities: Can you perform most household chores?y Are you deaf or have significant trouble hearing?n Do you feel that you have a problem with memory?n Do you feel safe at home?y Last dentist visit? Goes every 3-4 months 8. Do you have any difficulty performing your everyday  activities?n Are you having any difficulty walking, taking medications on your own, and or difficulty managing daily home needs?n Do you have difficulty walking or climbing stairs?n Do you have difficulty dressing or bathing?n Do you have difficulty doing errands alone such as visiting a doctor's office or shopping?n Do you currently have any difficulty preparing food and eating?n Do you currently have any difficulty using the toilet?n Do you have any difficulty managing your finances?n Do you have any difficulties with housekeeping of managing your housekeeping?n   Do you have Advanced Directives in place (Living Will, Healthcare Power or Attorney)? y   Last eye Exam and location?Dr. Cleatus   Do you currently use prescribed or non-prescribed narcotic or opioid pain medications?n  Do you have a history or close family history of breast, ovarian, tubal or peritoneal cancer or a family member with BRCA (breast cancer susceptibility 1 and 2) gene mutations? Mother, sister, daughter    ----------------------------------------------------------------------------------------------------------------------------------------------------------------------------------------------------------------------  Because this visit was a virtual/telehealth visit, some criteria may be missing or patient reported. Any vitals not documented were not able to be obtained and vitals that have been documented are patient reported.    MEDICARE ANNUAL PREVENTIVE CARE VISIT WITH PROVIDER (Welcome to Medicare, initial annual wellness or annual wellness exam)  Virtual Visit via Video Note  I connected with Damiel Barthold. on 10/06/23  by a video enabled telemedicine application and verified that I am speaking with the correct person using two identifiers.  Location patient: home Location provider:work or home office Persons participating in the virtual visit: patient, provider  Concerns and/or follow up  today: doing good, no concerns   See HM section in Epic for other details of completed HM.    ROS: negative for report of fevers, unintentional weight loss, vision changes, vision loss, hearing loss or change, chest pain, sob, hemoptysis, melena, hematochezia, hematuria, falls, bleeding or bruising, thoughts of suicide or self harm, memory loss  Patient-completed extensive health risk assessment - reviewed and discussed with the patient: See Health Risk Assessment completed with patient prior to the visit either above or in recent phone note. This was reviewed in detailed with the patient today and appropriate recommendations, orders and referrals were placed as needed per Summary below and patient instructions.   Review of Medical History: -PMH, PSH, Family History and current specialty and care providers reviewed and updated and listed below   Patient Care Team: Micheal Wolm ORN, MD as PCP - General (Family Medicine)   Past Medical History:  Diagnosis Date   Arthritis    Chicken pox    Colon polyp 03/31/2010   Fainting spell    GERD (gastroesophageal reflux disease)    Trigeminal neuralgia     Past Surgical History:  Procedure Laterality Date   FOOT SURGERY  04/01/2011   halux rigidus/bones spur   REPLACEMENT TOTAL HIP W/  RESURFACING IMPLANTS Right     Social History   Socioeconomic History   Marital status: Married    Spouse name: Not on file   Number of children: Not on file   Years of education: Not on file   Highest education level: Master's degree (e.g., MA, MS, MEng, MEd, MSW, MBA)  Occupational History   Not on file  Tobacco Use   Smoking status: Former    Current packs/day: 0.00    Average packs/day: 1 pack/day for 30.0 years (30.0 ttl pk-yrs)    Types: Cigarettes    Start date: 04/23/1978    Quit date: 04/23/2008    Years since quitting: 15.4   Smokeless tobacco: Never  Vaping Use   Vaping status: Never Used  Substance and Sexual Activity   Alcohol  use: Yes    Alcohol/week: 10.0 standard drinks of alcohol    Types: 10 Glasses of wine per week   Drug use: Never   Sexual activity: Not Currently  Other Topics Concern   Not on file  Social History Narrative   Not on file   Social Drivers of Health   Financial Resource Strain: Low Risk  (10/06/2023)   Overall Financial Resource Strain (CARDIA)    Difficulty of Paying Living Expenses: Not hard at all  Food Insecurity: No Food Insecurity (10/06/2023)   Hunger Vital Sign    Worried About Running Out of Food in the Last Year: Never true    Ran Out of Food in the Last Year: Never true  Transportation Needs: No Transportation Needs (10/06/2023)   PRAPARE - Administrator, Civil Service (Medical): No    Lack of Transportation (Non-Medical): No  Physical Activity: Sufficiently Active (10/06/2023)   Exercise Vital Sign    Days of Exercise per Week: 4 days    Minutes of Exercise per Session: 40 min  Stress: No Stress Concern Present (10/06/2023)   [employer] of Occupational Health - Occupational Stress Questionnaire    Feeling of Stress: Not at all  Social Connections: Moderately Integrated (10/06/2023)   Social Connection and Isolation Panel    Frequency of Communication with Friends and Family: Once a week    Frequency of Social Gatherings with Friends  and Family: Once a week    Attends Religious Services: More than 4 times per year    Active Member of Clubs or Organizations: Yes    Attends Banker Meetings: More than 4 times per year    Marital Status: Married  Catering manager Violence: Not At Risk (08/05/2021)   Humiliation, Afraid, Rape, and Kick questionnaire    Fear of Current or Ex-Partner: No    Emotionally Abused: No    Physically Abused: No    Sexually Abused: No    Family History  Problem Relation Age of Onset   Cancer Mother        breast   Stroke Mother    Diabetes Mother        type ll   Emphysema Father    Cancer Sister 52       lung  cancer    Current Outpatient Medications on File Prior to Visit  Medication Sig Dispense Refill   cyanocobalamin  (VITAMIN B12) 1000 MCG tablet Take 1,000 mcg by mouth daily.     gabapentin  (NEURONTIN ) 300 MG capsule Take two capsules by mouth at bedtime each night. 180 capsule 1   lisinopril  (ZESTRIL ) 10 MG tablet TAKE 1 TABLET BY MOUTH EVERY DAY 90 tablet 0   simvastatin  (ZOCOR ) 20 MG tablet TAKE 1 TABLET BY MOUTH EVERY DAY IN THE EVENING 90 tablet 3   traZODone (DESYREL) 50 MG tablet Take 50 mg by mouth at bedtime.     No current facility-administered medications on file prior to visit.    No Known Allergies     Physical Exam Vitals requested from patient and listed below if patient had equipment and was able to obtain at home for this virtual visit: There were no vitals filed for this visit. Estimated body mass index is 27.06 kg/m as calculated from the following:   Height as of 05/29/23: 5' 11 (1.803 m).   Weight as of 05/29/23: 194 lb (88 kg).  EKG (optional): deferred due to virtual visit  GENERAL: alert, oriented, no acute distress detected; full vision exam deferred due to pandemic and/or virtual encounter  HEENT: atraumatic, conjunttiva clear, no obvious abnormalities on inspection of external nose and ears  NECK: normal movements of the head and neck  LUNGS: on inspection no signs of respiratory distress, breathing rate appears normal, no obvious gross SOB, gasping or wheezing  CV: no obvious cyanosis  MS: moves all visible extremities without noticeable abnormality  PSYCH/NEURO: pleasant and cooperative, no obvious depression or anxiety, speech and thought processing grossly intact, Cognitive function grossly intact  Flowsheet Row Office Visit from 08/04/2022 in [facility] [place]  PHQ-9 Total Score 1        10/06/2023    5:07 PM 08/04/2022    2:03 PM 08/05/2021   12:46 PM 08/01/2020   11:10 AM 04/24/2020    9:43 AM  Depression screen PHQ  2/9  Decreased Interest 0 0 0 0 0  Down, Depressed, Hopeless 0 0 0 0 0  PHQ - 2 Score 0 0 0 0 0  Altered sleeping  1   2  Tired, decreased energy  0   0  Change in appetite  0   0  Feeling bad or failure about yourself   0   0  Trouble concentrating  0   0  Moving slowly or fidgety/restless  0   0  Suicidal thoughts  0   0  PHQ-9 Score  1  2  Difficult doing work/chores  Not difficult at all   Not difficult at all       08/05/2021   12:50 PM 10/04/2021   11:04 AM 08/04/2022    2:03 PM 08/06/2022    5:12 PM 10/06/2023    5:07 PM  Fall Risk  Falls in the past year? 0 0 0 0 0  Was there an injury with Fall? 0  0 0 0  Fall Risk Category Calculator 0  0 0 0  Fall Risk Category (Retired) Low       (RETIRED) Patient Fall Risk Level Low fall risk       Patient at Risk for Falls Due to No Fall Risks  No Fall Risks    Fall risk Follow up   Falls evaluation completed  Falls evaluation completed     Data saved with a previous flowsheet row definition     SUMMARY AND PLAN:  Encounter for Medicare annual wellness exam  Discussed applicable health maintenance/preventive health measures and advised and referred or ordered per patient preferences: -discussed recs for covid vaccine Health Maintenance  Topic Date Due   COVID-19 Vaccine (6 - 2024-25 season) 11/30/2022   Zoster Vaccines- Shingrix (2 of 2) 10/05/2028 (Originally 07/17/2020)   INFLUENZA VACCINE  10/30/2023   Medicare Annual Wellness (AWV)  10/05/2024   DTaP/Tdap/Td (2 - Tdap) 01/06/2029   Pneumococcal Vaccine: 50+ Years  Completed   Hepatitis C Screening  Completed   Hepatitis B Vaccines  Aged Out   HPV VACCINES  Aged Out   Meningococcal B Vaccine  Aged Out   Lung Cancer Screening  Discontinued   Colonoscopy  Discontinued     Education and counseling on the following was provided based on the above review of health and a plan/checklist for the patient, along with additional information discussed, was provided for the patient  in the patient instructions :  .  -Advised and counseled on a healthy lifestyle - including the importance of a healthy diet, regular physical activity -Reviewed patient's current diet. Advised and counseled on a whole foods based healthy diet. Discussed replacing processed grains with whole grains.  A summary of a healthy diet was provided in the Patient Instructions.  -reviewed patient's current physical activity level and discussed exercise guidelines for adults.Further resources provided in patient instructions.  -Advise yearly dental visits at minimum and regular eye exams   Follow up: see patient instructions   Patient Instructions  I really enjoyed getting to talk with you today! I am available on Tuesdays and Thursdays for virtual visits if you have any questions or concerns, or if I can be of any further assistance.   CHECKLIST FROM ANNUAL WELLNESS VISIT:  -Follow up (please call to schedule if not scheduled after visit):   -yearly for annual wellness visit with primary care office  Here is a list of your preventive care/health maintenance measures and the plan for each if any are due:  PLAN For any measures below that may be due:     1. Please obtain vaccine record from the pharmacy and provide to our office so that we can update your record.   Health Maintenance  Topic Date Due   COVID-19 Vaccine (6 - 2024-25 season) 11/30/2022   Zoster Vaccines- Shingrix (2 of 2) 10/05/2028 (Originally 07/17/2020)   INFLUENZA VACCINE  10/30/2023   Medicare Annual Wellness (AWV)  10/05/2024   DTaP/Tdap/Td (2 - Tdap) 01/06/2029   Pneumococcal Vaccine: 50+ Years  Completed  Hepatitis C Screening  Completed   Hepatitis B Vaccines  Aged Out   HPV VACCINES  Aged Out   Meningococcal B Vaccine  Aged Out   Lung Cancer Screening  Discontinued   Colonoscopy  Discontinued    -See a dentist at least yearly  -Get your eyes checked and then per your eye specialist's  recommendations  -Other issues addressed today:   -I have included below further information regarding a healthy whole foods based diet, physical activity guidelines for adults, stress management and opportunities for social connections. I hope you find this information useful.   -----------------------------------------------------------------------------------------------------------------------------------------------------------------------------------------------------------------------------------------------------------    NUTRITION: -eat real food: lots of colorful vegetables (half the plate) and fruits -5-7 servings of vegetables and fruits per day (fresh or steamed is best), exp. 2 servings of vegetables with lunch and dinner and 2 servings of fruit per day. Berries and greens such as kale and collards are great choices.  -consume on a regular basis:  fresh fruits, fresh veggies, fish, nuts, seeds, healthy oils (such as olive oil, avocado oil), whole grains (make sure for bread/pasta/crackers/etc., that the first ingredient on label contains the word whole), legumes. -can eat small amounts of dairy and lean meat (no larger than the palm of your hand), but avoid processed meats such as ham, bacon, lunch meat, etc. -drink water -try to avoid fast food and pre-packaged foods, processed meat, ultra processed foods/beverages (donuts, candy, etc.) -most experts advise limiting sodium to < 2300mg  per day, should limit further is any chronic conditions such as high blood pressure, heart disease, diabetes, etc. The American Heart Association advised that < 1500mg  is is ideal -try to avoid foods/beverages that contain any ingredients with names you do not recognize  -try to avoid foods/beverages  with added sugar or sweeteners/sweets  -try to avoid sweet drinks (including diet drinks): soda, juice, Gatorade, sweet tea, power drinks, diet drinks -try to avoid white rice, white bread, pasta  (unless whole grain)  EXERCISE GUIDELINES FOR ADULTS: -if you wish to increase your physical activity, do so gradually and with the approval of your doctor -STOP and seek medical care immediately if you have any chest pain, chest discomfort or trouble breathing when starting or increasing exercise  -move and stretch your body, legs, feet and arms when sitting for long periods -Physical activity guidelines for optimal health in adults: -get at least 150 minutes per week of moderate exercise (can talk, but not sing); this is about 20-30 minutes of sustained activity 5-7 days per week or two 10-15 minute episodes of sustained activity 5-7 days per week -do some muscle building/resistance training/strength training at least 2 days per week  -balance exercises 3+ days per week:   Stand somewhere where you have something sturdy to hold onto if you lose balance    1) lift up on toes, then back down, start with 5x per day and work up to 20x   2) stand and lift one leg straight out to the side so that foot is a few inches of the floor, start with 5x each side and work up to 20x each side   3) stand on one foot, start with 5 seconds each side and work up to 20 seconds on each side  If you need ideas or help with getting more active:  -Silver sneakers https://tools.silversneakers.com  -Walk with a Doc: http://www.duncan-williams.com/  -try to include resistance (weight lifting/strength building) and balance exercises twice per week: or the following link for ideas: http://castillo-powell.com/  BuyDucts.dk  STRESS MANAGEMENT: -can try meditating, or just sitting quietly with deep breathing while intentionally relaxing all parts of your body for 5 minutes daily -if you need further help with stress, anxiety or depression please follow up with your primary doctor or contact the wonderful folks at [facility]: [phone]  SOCIAL CONNECTIONS: -options in [place] if you wish to engage in more social and exercise related activities:  -Silver sneakers https://tools.silversneakers.com  -Walk with a Doc: http://www.duncan-williams.com/  -Check out the [facility] Active Adults 50+ section on the [place] [employer] (hiking clubs, book clubs, cards and games, chess, exercise classes, aquatic classes and much more) - see the website for details: https://www.[place].gov/departments/parks-recreation/active-adults50  -YouTube has lots of exercise videos for different ages and abilities as well  -[facility] (a variety of indoor and outdoor inperson activities for adults). [phone]. [address]  -Virtual Online Classes (a variety of topics): see seniorplanet.org or call [phone]  -consider volunteering at a school, hospice center, church, senior center or elsewhere            Adam JONELLE Cramp, DO

## 2023-10-06 NOTE — Patient Instructions (Addendum)
 I really enjoyed getting to talk with you today! I am available on Tuesdays and Thursdays for virtual visits if you have any questions or concerns, or if I can be of any further assistance.   CHECKLIST FROM ANNUAL WELLNESS VISIT:  -Follow up (please call to schedule if not scheduled after visit):   -yearly for annual wellness visit with primary care office  Here is a list of your preventive care/health maintenance measures and the plan for each if any are due:  PLAN For any measures below that may be due:     1. Please obtain vaccine record from the pharmacy and provide to our office so that we can update your record.   Health Maintenance  Topic Date Due   COVID-19 Vaccine (6 - 2024-25 season) 11/30/2022   Zoster Vaccines- Shingrix (2 of 2) 10/05/2028 (Originally 07/17/2020)   INFLUENZA VACCINE  10/30/2023   Medicare Annual Wellness (AWV)  10/05/2024   DTaP/Tdap/Td (2 - Tdap) 01/06/2029   Pneumococcal Vaccine: 50+ Years  Completed   Hepatitis C Screening  Completed   Hepatitis B Vaccines  Aged Out   HPV VACCINES  Aged Out   Meningococcal B Vaccine  Aged Out   Lung Cancer Screening  Discontinued   Colonoscopy  Discontinued    -See a dentist at least yearly  -Get your eyes checked and then per your eye specialist's recommendations  -Other issues addressed today:   -I have included below further information regarding a healthy whole foods based diet, physical activity guidelines for adults, stress management and opportunities for social connections. I hope you find this information useful.   -----------------------------------------------------------------------------------------------------------------------------------------------------------------------------------------------------------------------------------------------------------    NUTRITION: -eat real food: lots of colorful vegetables (half the plate) and fruits -5-7 servings of vegetables and fruits per day  (fresh or steamed is best), exp. 2 servings of vegetables with lunch and dinner and 2 servings of fruit per day. Berries and greens such as kale and collards are great choices.  -consume on a regular basis:  fresh fruits, fresh veggies, fish, nuts, seeds, healthy oils (such as olive oil, avocado oil), whole grains (make sure for bread/pasta/crackers/etc., that the first ingredient on label contains the word whole), legumes. -can eat small amounts of dairy and lean meat (no larger than the palm of your hand), but avoid processed meats such as ham, bacon, lunch meat, etc. -drink water -try to avoid fast food and pre-packaged foods, processed meat, ultra processed foods/beverages (donuts, candy, etc.) -most experts advise limiting sodium to < 2300mg  per day, should limit further is any chronic conditions such as high blood pressure, heart disease, diabetes, etc. The American Heart Association advised that < 1500mg  is is ideal -try to avoid foods/beverages that contain any ingredients with names you do not recognize  -try to avoid foods/beverages  with added sugar or sweeteners/sweets  -try to avoid sweet drinks (including diet drinks): soda, juice, Gatorade, sweet tea, power drinks, diet drinks -try to avoid white rice, white bread, pasta (unless whole grain)  EXERCISE GUIDELINES FOR ADULTS: -if you wish to increase your physical activity, do so gradually and with the approval of your doctor -STOP and seek medical care immediately if you have any chest pain, chest discomfort or trouble breathing when starting or increasing exercise  -move and stretch your body, legs, feet and arms when sitting for long periods -Physical activity guidelines for optimal health in adults: -get at least 150 minutes per week of moderate exercise (can talk, but not sing); this is about  20-30 minutes of sustained activity 5-7 days per week or two 10-15 minute episodes of sustained activity 5-7 days per week -do some muscle  building/resistance training/strength training at least 2 days per week  -balance exercises 3+ days per week:   Stand somewhere where you have something sturdy to hold onto if you lose balance    1) lift up on toes, then back down, start with 5x per day and work up to 20x   2) stand and lift one leg straight out to the side so that foot is a few inches of the floor, start with 5x each side and work up to 20x each side   3) stand on one foot, start with 5 seconds each side and work up to 20 seconds on each side  If you need ideas or help with getting more active:  -Silver sneakers https://tools.silversneakers.com  -Walk with a Doc: http://www.duncan-williams.com/  -try to include resistance (weight lifting/strength building) and balance exercises twice per week: or the following link for ideas: http://castillo-powell.com/  BuyDucts.dk  STRESS MANAGEMENT: -can try meditating, or just sitting quietly with deep breathing while intentionally relaxing all parts of your body for 5 minutes daily -if you need further help with stress, anxiety or depression please follow up with your primary doctor or contact the wonderful folks at [facility]: [phone]  SOCIAL CONNECTIONS: -options in [place] if you wish to engage in more social and exercise related activities:  -Silver sneakers https://tools.silversneakers.com  -Walk with a Doc: http://www.duncan-williams.com/  -Check out the [facility] Active Adults 50+ section on the [place] of [employer] (hiking clubs, book clubs, cards and games, chess, exercise classes, aquatic classes and much more) - see the website for details: https://www.[place].gov/departments/parks-recreation/active-adults50  -YouTube has lots of exercise videos for different ages and abilities as well  -[facility] (a variety of indoor and outdoor  inperson activities for adults). [phone]. [address]  -Virtual Online Classes (a variety of topics): see seniorplanet.org or call [phone]  -consider volunteering at a school, hospice center, church, senior center or elsewhere

## 2023-10-24 ENCOUNTER — Other Ambulatory Visit: Payer: Self-pay | Admitting: Family Medicine

## 2023-11-05 DIAGNOSIS — G4700 Insomnia, unspecified: Secondary | ICD-10-CM | POA: Diagnosis not present

## 2023-12-02 ENCOUNTER — Other Ambulatory Visit: Payer: Self-pay | Admitting: Family Medicine

## 2024-01-15 ENCOUNTER — Ambulatory Visit: Payer: Self-pay | Admitting: Family Medicine

## 2024-01-15 ENCOUNTER — Other Ambulatory Visit: Payer: Self-pay | Admitting: Family Medicine

## 2024-01-15 NOTE — Telephone Encounter (Signed)
 Copied from CRM #[id number]. Topic: Clinical - Prescription Issue >> Jan 15, 2024  3:18 PM Adam Choi wrote: Reason for CRM: Patient would like a call back pertaining to why his refill for gabapentin  (NEURONTIN ) 300 MG capsule was denied. Refusal reason: refill not appropriate no additional details notate.  CB#[phone]

## 2024-01-15 NOTE — Telephone Encounter (Signed)
 I spoke with the patient's pharmacy and they reported he has 1 refill on file and will contact patient once this is ready. Patient is aware.

## 2024-01-27 ENCOUNTER — Ambulatory Visit (HOSPITAL_BASED_OUTPATIENT_CLINIC_OR_DEPARTMENT_OTHER)
Admission: RE | Admit: 2024-01-27 | Discharge: 2024-01-27 | Disposition: A | Discharge status: Home / Self Care | Source: Ambulatory Visit | Attending: Acute Care | Admitting: Acute Care

## 2024-01-27 DIAGNOSIS — Z122 Encounter for screening for malignant neoplasm of respiratory organs: Secondary | ICD-10-CM | POA: Diagnosis not present

## 2024-01-27 DIAGNOSIS — Z87891 Personal history of nicotine dependence: Secondary | ICD-10-CM | POA: Diagnosis not present

## 2024-04-19 ENCOUNTER — Other Ambulatory Visit: Payer: Self-pay | Admitting: Family Medicine

## 2024-04-27 LAB — OPHTHALMOLOGY REPORT-SCANNED

## 2024-05-01 ENCOUNTER — Other Ambulatory Visit: Payer: Self-pay | Admitting: Family Medicine
